# Patient Record
Sex: Female | Born: 1985 | Race: Black or African American | Hispanic: No | Marital: Single | Smoking: Former smoker
Health system: Southern US, Community
[De-identification: ages and names within clinical notes are randomized; demographics above are authoritative.]

## PROBLEM LIST (undated history)

## (undated) DIAGNOSIS — J45909 Unspecified asthma, uncomplicated: Secondary | ICD-10-CM

## (undated) HISTORY — DX: Unspecified asthma, uncomplicated: J45.909

---
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
## 2005-05-06 ENCOUNTER — Ambulatory Visit: Payer: Self-pay | Admitting: Family Medicine

## 2005-05-15 ENCOUNTER — Ambulatory Visit: Payer: Self-pay | Admitting: Family Medicine

## 2019-02-10 NOTE — L&D Delivery Note (Addendum)
OB/GYN Faculty Practice Delivery Note  Hannah Crosby is a 34 y.o. G1P0 s/p low forceps-assisted vaginal delivery at [redacted]w[redacted]d. She was admitted for post dates induction at 41 weeks.   ROM: 14h 46m with meconium-stained fluid fluid GBS Status: negative Maximum Maternal Temperature: 100.6*F  Labor Progress: . Patient was admitted for post dates induction at 41 weeks. Her induction was started with FB and cytotec. She was eventually started on Pitocin and AROMed (with meconium-stained fluid). She developed a fever and was started on Amp/Gent for presumed Chorioamnionitis, FSE and IUPC were placed with amnioinfusion. She had a pitocin break from 1800-2130 on 07/17/19, then pitocin was resumed. She progressed to complete, labored down for about an hour, and started pushing. She was +2 station, and due to minimal variability, late decelerations, and fetal tachycardia an operative vaginal delivery was offered.  Delivery Date/Time: 07/18/19, 0736 hours  Indication for operative vaginal delivery: Fetal tachycardia with late decelerations  Risks of forcep assistance were discussed in detail, including but not limited to, bleeding, infection, damage to maternal tissues, fetal cephalohematoma/bruising, inability to effect vaginal delivery of the head or shoulder dystocia that cannot be resolved by established maneuvers and need for emergency cesarean section.  Patient gave verbal consent.  Patient was examined and found to be fully dilated with fetal station of +2 for the BPD and EFW 3400gm and fetus in LOA. Pelvis felt adequate. Anesthesia, NICU, and the OR were made aware.  Foley catheter removed, and the perineum and vagina were prepped with betadine, as were the forceps. Her epidural was dosed up by anesthesia once NICU team was present. The [employer] were positioned around the fetal head and articulated together without issue.   With the next contraction, the patient pushed and the forceps were  removed just prior to delivery of the head. There was a nuchal which was reduced at the perineum.  The infant was then delivered atraumatically, noted to be a viable female infant. Due to lack of tone and no spontaneous cry, the cord was doubly clamped and cut immediately; and the infant was taken immediately over to the awaiting neonatology team. Arterial and venous cord gases were drawn. There was spontaneous placental delivery, intact with three-vessel cord. Fundus firm with massage and Pitocin. Labia, perineum, vagina, and cervix were inspected, and a 3A perineal laceration with a sulcal tear was noted- repaired with Vicryl in the usual fashion, with 1-0 for the third degree and 2-0 for the sulcal.   Sponge, instrument and needle counts were correct x2.  The patient was stable after delivery. Neonatology present for delivery.  Placenta: intact, 3 vessel cord, to pathology Complications: Code APGAR called, infant intubated in room Lacerations: 3A, left sulcal tear EBL: 500 mL Analgesia: epidural, re-dosed by anesthesia for forceps delivery Venous cord pH: 7.17  Postpartum Planning [x]  message to sent to schedule follow-up  [x]  vaccines UTD  Infant: Boy  APGARs 2, 4, 5  3310 g Venous 7.174, Bicarbonate 20.3 Arterial sample too small to run  , DO OB/GYN Fellow, Faculty Practice    Agree with above. I was present for the entire procedure.   MD Attending [facility])

## 2019-02-17 ENCOUNTER — Encounter: Payer: Self-pay | Admitting: Certified Nurse Midwife

## 2019-02-17 ENCOUNTER — Other Ambulatory Visit: Payer: Self-pay

## 2019-02-17 ENCOUNTER — Ambulatory Visit (INDEPENDENT_AMBULATORY_CARE_PROVIDER_SITE_OTHER): Payer: PRIVATE HEALTH INSURANCE | Admitting: Certified Nurse Midwife

## 2019-02-17 DIAGNOSIS — Z1151 Encounter for screening for human papillomavirus (HPV): Secondary | ICD-10-CM

## 2019-02-17 DIAGNOSIS — Z113 Encounter for screening for infections with a predominantly sexual mode of transmission: Secondary | ICD-10-CM

## 2019-02-17 DIAGNOSIS — B9689 Other specified bacterial agents as the cause of diseases classified elsewhere: Secondary | ICD-10-CM | POA: Diagnosis not present

## 2019-02-17 DIAGNOSIS — Z3A19 19 weeks gestation of pregnancy: Secondary | ICD-10-CM | POA: Diagnosis not present

## 2019-02-17 DIAGNOSIS — Z3402 Encounter for supervision of normal first pregnancy, second trimester: Secondary | ICD-10-CM

## 2019-02-17 DIAGNOSIS — O2602 Excessive weight gain in pregnancy, second trimester: Secondary | ICD-10-CM | POA: Diagnosis not present

## 2019-02-17 DIAGNOSIS — Z124 Encounter for screening for malignant neoplasm of cervix: Secondary | ICD-10-CM

## 2019-02-17 DIAGNOSIS — N760 Acute vaginitis: Secondary | ICD-10-CM | POA: Diagnosis not present

## 2019-02-17 DIAGNOSIS — B373 Candidiasis of vulva and vagina: Secondary | ICD-10-CM | POA: Diagnosis not present

## 2019-02-17 DIAGNOSIS — O99212 Obesity complicating pregnancy, second trimester: Secondary | ICD-10-CM

## 2019-02-17 DIAGNOSIS — Z3400 Encounter for supervision of normal first pregnancy, unspecified trimester: Secondary | ICD-10-CM | POA: Insufficient documentation

## 2019-02-17 DIAGNOSIS — N898 Other specified noninflammatory disorders of vagina: Secondary | ICD-10-CM | POA: Diagnosis not present

## 2019-02-17 DIAGNOSIS — O99210 Obesity complicating pregnancy, unspecified trimester: Secondary | ICD-10-CM | POA: Insufficient documentation

## 2019-02-17 DIAGNOSIS — O2600 Excessive weight gain in pregnancy, unspecified trimester: Secondary | ICD-10-CM | POA: Insufficient documentation

## 2019-02-17 MED ORDER — ASPIRIN 81 MG PO CHEW: 81.0000 mg | CHEWABLE_TABLET | Freq: Every day | ORAL | 1 refills | Status: DC

## 2019-02-17 MED ORDER — PRENATAL VITAMIN 27-0.8 MG PO TABS: 1.0000 | ORAL_TABLET | Freq: Every day | ORAL | 11 refills | Status: DC

## 2019-02-17 MED ORDER — BLOOD PRESSURE CUFF MISC: 1.0000 | 0 refills | Status: DC | PRN

## 2019-02-17 NOTE — Patient Instructions (Addendum)
Second Trimester of Pregnancy The second trimester is from week 14 through week 27 (months 4 through 6). The second trimester is often a time when you feel your best. Your body has adjusted to being pregnant, and you begin to feel better physically. Usually, morning sickness has lessened or quit completely, you may have more energy, and you may have an increase in appetite. The second trimester is also a time when the fetus is growing rapidly. At the end of the sixth month, the fetus is about 9 inches long and weighs about 1 pounds. You will likely begin to feel the baby move (quickening) between 16 and 20 weeks of pregnancy. Body changes during your second trimester Your body continues to go through many changes during your second trimester. The changes vary from woman to woman.  Your weight will continue to increase. You will notice your lower abdomen bulging out.  You may begin to get stretch marks on your hips, abdomen, and breasts.  You may develop headaches that can be relieved by medicines. The medicines should be approved by your health care provider.  You may urinate more often because the fetus is pressing on your bladder.  You may develop or continue to have heartburn as a result of your pregnancy.  You may develop constipation because certain hormones are causing the muscles that push waste through your intestines to slow down.  You may develop hemorrhoids or swollen, bulging veins (varicose veins).  You may have back pain. This is caused by: ? Weight gain. ? Pregnancy hormones that are relaxing the joints in your pelvis. ? A shift in weight and the muscles that support your balance.  Your breasts will continue to grow and they will continue to become tender.  Your gums may bleed and may be sensitive to brushing and flossing.  Dark spots or blotches (chloasma, mask of pregnancy) may develop on your face. This will likely fade after the baby is born.  A dark line from your  belly button to the pubic area (linea nigra) may appear. This will likely fade after the baby is born.  You may have changes in your hair. These can include thickening of your hair, rapid growth, and changes in texture. Some women also have hair loss during or after pregnancy, or hair that feels dry or thin. Your hair will most likely return to normal after your baby is born. What to expect at prenatal visits During a routine prenatal visit:  You will be weighed to make sure you and the fetus are growing normally.  Your blood pressure will be taken.  Your abdomen will be measured to track your baby's growth.  The fetal heartbeat will be listened to.  Any test results from the previous visit will be discussed. Your health care provider may ask you:  How you are feeling.  If you are feeling the baby move.  If you have had any abnormal symptoms, such as leaking fluid, bleeding, severe headaches, or abdominal cramping.  If you are using any tobacco products, including cigarettes, chewing tobacco, and electronic cigarettes.  If you have any questions. Other tests that may be performed during your second trimester include:  Blood tests that check for: ? Low iron levels (anemia). ? High blood sugar that affects pregnant women (gestational diabetes) between 24 and 28 weeks. ? Rh antibodies. This is to check for a protein on red blood cells (Rh factor).  Urine tests to check for infections, diabetes, or protein in the   urine.  An ultrasound to confirm the proper growth and development of the baby.  An amniocentesis to check for possible genetic problems.  Fetal screens for spina bifida and Down syndrome.  HIV (human immunodeficiency virus) testing. Routine prenatal testing includes screening for HIV, unless you choose not to have this test. Follow these instructions at home: Medicines  Follow your health care provider's instructions regarding medicine use. Specific medicines may be  either safe or unsafe to take during pregnancy.  Take a prenatal vitamin that contains at least 600 micrograms (mcg) of folic acid.  If you develop constipation, try taking a stool softener if your health care provider approves. Eating and drinking   Eat a balanced diet that includes fresh fruits and vegetables, whole grains, good sources of protein such as meat, eggs, or tofu, and low-fat dairy. Your health care provider will help you determine the amount of weight gain that is right for you.  Avoid raw meat and uncooked cheese. These carry germs that can cause birth defects in the baby.  If you have low calcium intake from food, talk to your health care provider about whether you should take a daily calcium supplement.  Limit foods that are high in fat and processed sugars, such as fried and sweet foods.  To prevent constipation: ? Drink enough fluid to keep your urine clear or pale yellow. ? Eat foods that are high in fiber, such as fresh fruits and vegetables, whole grains, and beans. Activity  Exercise only as directed by your health care provider. Most women can continue their usual exercise routine during pregnancy. Try to exercise for 30 minutes at least 5 days a week. Stop exercising if you experience uterine contractions.  Avoid heavy lifting, wear low heel shoes, and practice good posture.  A sexual relationship may be continued unless your health care provider directs you otherwise. Relieving pain and discomfort  Wear a good support bra to prevent discomfort from breast tenderness.  Take warm sitz baths to soothe any pain or discomfort caused by hemorrhoids. Use hemorrhoid cream if your health care provider approves.  Rest with your legs elevated if you have leg cramps or low back pain.  If you develop varicose veins, wear support hose. Elevate your feet for 15 minutes, 3-4 times a day. Limit salt in your diet. Prenatal Care  Write down your questions. Take them to  your prenatal visits.  Keep all your prenatal visits as told by your health care provider. This is important. Safety  Wear your seat belt at all times when driving.  Make a list of emergency phone numbers, including numbers for family, friends, the hospital, and police and fire departments. General instructions  Ask your health care provider for a referral to a local prenatal education class. Begin classes no later than the beginning of month 6 of your pregnancy.  Ask for help if you have counseling or nutritional needs during pregnancy. Your health care provider can offer advice or refer you to specialists for help with various needs.  Do not use hot tubs, steam rooms, or saunas.  Do not douche or use tampons or scented sanitary pads.  Do not cross your legs for long periods of time.  Avoid cat litter boxes and soil used by cats. These carry germs that can cause birth defects in the baby and possibly loss of the fetus by miscarriage or stillbirth.  Avoid all smoking, herbs, alcohol, and unprescribed drugs. Chemicals in these products can affect the formation   and growth of the baby.  Do not use any products that contain nicotine or tobacco, such as cigarettes and e-cigarettes. If you need help quitting, ask your health care provider.  Visit your dentist if you have not gone yet during your pregnancy. Use a soft toothbrush to brush your teeth and be gentle when you floss. Contact a health care provider if:  You have dizziness.  You have mild pelvic cramps, pelvic pressure, or nagging pain in the abdominal area.  You have persistent nausea, vomiting, or diarrhea.  You have a bad smelling vaginal discharge.  You have pain when you urinate. Get help right away if:  You have a fever.  You are leaking fluid from your vagina.  You have spotting or bleeding from your vagina.  You have severe abdominal cramping or pain.  You have rapid weight gain or weight loss.  You have  shortness of breath with chest pain.  You notice sudden or extreme swelling of your face, hands, ankles, feet, or legs.  You have not felt your baby move in over an hour.  You have severe headaches that do not go away when you take medicine.  You have vision changes. Summary  The second trimester is from week 14 through week 27 (months 4 through 6). It is also a time when the fetus is growing rapidly.  Your body goes through many changes during pregnancy. The changes vary from woman to woman.  Avoid all smoking, herbs, alcohol, and unprescribed drugs. These chemicals affect the formation and growth your baby.  Do not use any tobacco products, such as cigarettes, chewing tobacco, and e-cigarettes. If you need help quitting, ask your health care provider.  Contact your health care provider if you have any questions. Keep all prenatal visits as told by your health care provider. This is important. This information is not intended to replace advice given to you by your health care provider. Make sure you discuss any questions you have with your health care provider. Document Revised: 05/20/2018 Document Reviewed: 03/03/2016 Elsevier Patient Education  2020 Elsevier Inc.   AREA PEDIATRIC/FAMILY PRACTICE PHYSICIANS  [facility] [address] [place], [place] [address] Phone - [phone]   Fax - [phone]  [facility] [address] [place], [place]  [address] Phone - [phone]   Fax - [phone]  [facility] [address] [place], [place]  [address] Phone - [phone]   Fax - [phone]  [facility] [address] [place], [place]  [address] Phone - [phone]   Fax - [phone]  [facility] [address] [place], [place]  [address] Phone - [phone]   Fax - [phone]  [facility] [address] [place], [place]  [address] Phone -  [phone]   Fax - [phone]  [facility] [address] [place], [place]  [address] Phone - [phone]   Fax - [phone]  [facility] AT [facility] [address] [place], [address] [place], [place]  [address] Phone - [phone]   Fax - [phone]  [facility] AT [facility] [address] [place], [place]  [address] Phone - [phone]   Fax - [phone] [facility] AT [place] [address] [place], [place]  [address] Phone - [phone]   Fax - [phone]  [facility] [address] [place], [place]  [address] Phone -  [phone]   Fax - [phone]  [place], [place], [place]  [address] Phone - [phone]   Fax - [place] [address], [place], [place]  [address] Phone - [phone]   Fax - [phone]  [facility] [address], [place], [place]  [address] Phone - [place], [place]  [address] Phone - [phone]   Fax - [place] [address], [place], [place]  [address] Phone - [phone]   Fax - [phone]  [place] [address] [place], [place]  [address] Phone - [phone]   Fax - [place]. [place], [place]  [address] Phone - [phone]   Fax - [place], [place], [place]  [address] Phone - [phone]   Fax - [place] [address], [place], [place]  [address] Phone - [phone]   Fax - [phone]  [facility] RUBIN [address] [place], [place]  [address] Phone - [phone]   Fax - [place] [address], [place], [place]  [address] Phone - [phone]   Fax - [phone]  [place] [address] [place], [place]  [address] Phone - [phone]   Fax - [phone] Arnaldo Natal [address] [place], [place]  [address] Phone - [phone]   Fax - [place] [address] [place], [place]  [address] Phone - [phone]   Fax - [place] [address], [place], [place]  [address] Phone - [phone]   Fax - [phone]   Places to have your son circumcised:    [facility] [phone] $480 by 4 wks  [facility] [phone] $[phone] by 4 wks  [facility] [phone] $[phone] by 2 wks  [facility] [phone] $250 by 7 days [place] [phone] $[phone] by 4 wks  These prices sometimes change but are roughly what you can expect to pay. Please call and confirm pricing.   Circumcision is considered an elective/non-medically necessary procedure. There are many reasons parents decide to have their sons circumsized. During the first year of life circumcised males have a reduced risk of urinary tract infections but after this year the rates between circumcised males and uncircumcised males are the same.  It is safe to have your son circumcised outside of the hospital and the places above perform them regularly.

## 2019-02-17 NOTE — Progress Notes (Signed)
BP cuff ordered per Donette Larry, CNM

## 2019-02-17 NOTE — Progress Notes (Signed)
Subjective:   Hannah Crosby is a 34 y.o. G1P0 at [redacted]w[redacted]d by LMP, and 10 wk [place] being seen today for her first obstetrical visit. She started her prenatal care in [place]. Her obstetrical history is significant for excessive weight gain and obesity. Patient does intend to breast feed. Pregnancy history fully reviewed.  Patient reports no complaints.  HISTORY: OB History  Gravida Para Term Preterm AB Living  1 0 0 0 0 0  SAB TAB Ectopic Multiple Live Births  0 0 0 0 0    # Outcome Date GA Lbr Len/2nd Weight Sex Delivery Anes PTL Lv  1 Current            Past Medical History:  Diagnosis Date  . Asthma    History reviewed. No pertinent surgical history. Family History  Problem Relation Age of Onset  . Diabetes Mother   . Diabetes Father   . Heart disease Father    Social History   Tobacco Use  . Smoking status: Former Research scientist (life sciences)  . Smokeless tobacco: Never Used  Substance Use Topics  . Alcohol use: Never  . Drug use: Never   No Known Allergies Current Outpatient Medications on File Prior to Visit  Medication Sig Dispense Refill  . ALBUTEROL IN Inhale into the lungs.    . Prenatal Vit-Fe Fumarate-FA (PRENATAL VITAMINS PO) Take by mouth.     No current facility-administered medications on file prior to visit.    Indications for ASA therapy (per uptodate) One of the following: Previous pregnancy with preeclampsia, especially early onset and with an adverse outcome No Multifetal gestation No Chronic hypertension No Type 1 or 2 diabetes mellitus No Chronic kidney disease No Autoimmune disease (antiphospholipid syndrome, systemic lupus erythematosus) Yes  Two or more of the following: Nulliparity Yes Obesity (body mass index >30 kg/m2) Yes Family history of preeclampsia in mother or sister No Age ?35 years No Sociodemographic characteristics (African American race, low socioeconomic level) Yes Personal risk factors (eg, previous pregnancy with low birth weight or  small for gestational age infant, previous adverse pregnancy outcome [eg, stillbirth], interval >10 years between pregnancies) No  Indications for early 1 hour GTT (per uptodate)  BMI >25 (>23 in Asian women) AND one of the following  Gestational diabetes mellitus in a previous pregnancy No Glycated hemoglobin ?5.7 percent (39 mmol/mol), impaired glucose tolerance, or impaired fasting glucose on previous testing No First-degree relative with diabetes Yes High-risk race/ethnicity (eg, African American, Latino, Native American, [place] American, Pacific Islander) Yes History of cardiovascular disease No Hypertension or on therapy for hypertension No High-density lipoprotein cholesterol level <35 mg/dL (0.90 mmol/L) and/or a triglyceride level >250 mg/dL (2.82 mmol/L) No Polycystic ovary syndrome No Physical inactivity No Other clinical condition associated with insulin resistance (eg, severe obesity, acanthosis nigricans) No Previous birth of an infant weighing ?4000 g No Previous stillbirth of unknown cause No   Exam   Vitals:   02/17/19 0946 02/17/19 0947  BP: 124/81   Pulse: 98   Temp: 98.2 F (36.8 C)   Weight: 214 lb (97.1 kg)   Height:  5\' 2"  (1.575 m)   Fetal Heart Rate (bpm): 140  Uterus:     Pelvic Exam: Perineum: no hemorrhoids, normal perineum   Vulva: normal external genitalia, no lesions   Vagina:  normal mucosa, normal discharge   Cervix: no lesions and normal, pap smear obtained    Adnexa: normal adnexa and no mass, fullness, tenderness   Bony Pelvis: average  System: General: well-developed, well-nourished female in no acute distress   Breast:  deferred   Skin: normal coloration and turgor, no rashes   Neurologic: oriented, normal, negative, normal mood   Extremities: normal strength, tone, and muscle mass, ROM of all joints is normal   HEENT PERRLA, extraocular movement intact and sclera clear, anicteric   Mouth/Teeth mucous membranes moist, pharynx normal  without lesions and dental hygiene good   Neck supple and no masses   Cardiovascular: regular rate and rhythm   Respiratory:  no respiratory distress, normal breath sounds   Abdomen: soft, non-tender; bowel sounds normal; no masses,  no organomegaly     Assessment:   Pregnancy: G1P0 Patient Active Problem List   Diagnosis Date Noted  . Supervision of normal first pregnancy 02/17/2019  . Obesity affecting pregnancy 02/17/2019  . Excess weight gain in pregnancy 02/17/2019     Plan:  1. Encounter for supervision of normal first pregnancy in second trimester - Babyscripts Schedule Optimization - Obstetric panel - HIV antibody (with reflex) - Panorama or [place] - Culture, OB Urine - Sickle Cell Scr - Prenatal Vit-Fe Fumarate-FA (PRENATAL VITAMIN) 27-0.8 MG TABS; Take 1 tablet by mouth daily.  Dispense: 30 tablet; Refill: 11 - Cervicovaginal ancillary only( [place]) - Cytology - PAP( [place]) - MFM [place] Detailed +14 Weeks  2. Obesity affecting pregnancy in second trimester - needs serial growth [place]  3. Excessive weight gain during pregnancy in second trimester - discusses weight gain goals - decrease carbs, increase protein, smaller meals and snacks; eliminate sugary foods and drinks - exercise 5 times a week for 30-45 min  Initial labs drawn. Early 1 hour GTT - will obtain A1C Started on ASA  Flu vax declined Continue prenatal vitamins Genetic Screening discussed, NIPS: requested. Ultrasound discussed; fetal anatomic survey: requested. Problem list reviewed and updated The nature of [facility] [place] with multiple MDs and other Advanced Practice Providers was explained to patient; also emphasized that fellows, residents, and students are part of our team. Routine obstetric precautions reviewed Return in about 4 weeks (around 03/17/2019).   Donette Larry, CNM 11:19 AM 02/17/19

## 2019-02-18 LAB — VITAMIN D 25 HYDROXY (VIT D DEFICIENCY, FRACTURES): Vit D, 25-Hydroxy: 14 ng/mL — ABNORMAL LOW (ref 30–100)

## 2019-02-18 LAB — HEMOGLOBIN A1C
Hgb A1c MFr Bld: 5.4 % of total Hgb (ref <5.7)
Mean Plasma Glucose: 108 (calc)
eAG (mmol/L): 6 (calc)

## 2019-02-19 LAB — URINE CULTURE, OB REFLEX

## 2019-02-19 LAB — CULTURE, OB URINE

## 2019-02-20 ENCOUNTER — Encounter: Payer: Self-pay | Admitting: *Deleted

## 2019-02-20 ENCOUNTER — Other Ambulatory Visit: Payer: Self-pay | Admitting: *Deleted

## 2019-02-20 ENCOUNTER — Telehealth: Payer: Self-pay | Admitting: *Deleted

## 2019-02-20 ENCOUNTER — Encounter: Payer: Self-pay | Admitting: Certified Nurse Midwife

## 2019-02-20 DIAGNOSIS — E559 Vitamin D deficiency, unspecified: Secondary | ICD-10-CM | POA: Insufficient documentation

## 2019-02-20 LAB — OBSTETRIC PANEL
Absolute Monocytes: 507 cells/uL (ref 200–950)
Antibody Screen: NOT DETECTED
Basophils Absolute: 18 cells/uL (ref 0–200)
Basophils Relative: 0.3 %
Eosinophils Absolute: 360 cells/uL (ref 15–500)
Eosinophils Relative: 6.1 %
HCT: 38.3 % (ref 35.0–45.0)
Hemoglobin: 12.7 g/dL (ref 11.7–15.5)
Hepatitis B Surface Ag: NONREACTIVE
Lymphs Abs: 1611 cells/uL (ref 850–3900)
MCH: 29.3 pg (ref 27.0–33.0)
MCHC: 33.2 g/dL (ref 32.0–36.0)
MCV: 88.2 fL (ref 80.0–100.0)
MPV: 11.7 fL (ref 7.5–12.5)
Monocytes Relative: 8.6 %
Neutro Abs: 3404 cells/uL (ref 1500–7800)
Neutrophils Relative %: 57.7 %
Platelets: 237 10*3/uL (ref 140–400)
RBC: 4.34 10*6/uL (ref 3.80–5.10)
RDW: 13.2 % (ref 11.0–15.0)
RPR Ser Ql: NONREACTIVE
Rubella: 15.7 Index
Total Lymphocyte: 27.3 %
WBC: 5.9 10*3/uL (ref 3.8–10.8)

## 2019-02-20 LAB — CERVICOVAGINAL ANCILLARY ONLY
Bacterial Vaginitis (gardnerella): POSITIVE — AB
Candida Glabrata: NEGATIVE
Candida Vaginitis: POSITIVE — AB
Chlamydia: NEGATIVE
Comment: NEGATIVE
Comment: NEGATIVE
Comment: NEGATIVE
Comment: NEGATIVE
Comment: NEGATIVE
Comment: NORMAL
Neisseria Gonorrhea: NEGATIVE
Trichomonas: NEGATIVE

## 2019-02-20 LAB — CYTOLOGY - PAP
Comment: NEGATIVE
Diagnosis: NEGATIVE
High risk HPV: NEGATIVE

## 2019-02-20 LAB — HIV ANTIBODY (ROUTINE TESTING W REFLEX): HIV 1&2 Ab, 4th Generation: NONREACTIVE

## 2019-02-20 LAB — SICKLE CELL SCREEN: Sickle Solubility Test - HGBRFX: NEGATIVE

## 2019-02-20 MED ORDER — VITAMIN D 25 MCG (1000 UNIT) PO TABS: 1000.0000 [IU] | ORAL_TABLET | Freq: Every day | ORAL | 6 refills | Status: DC

## 2019-02-20 NOTE — Telephone Encounter (Signed)
Notified patient through my chart that correction made on her Vitamin D RX.  She is to be taking 4000 units daily per [facility]

## 2019-02-28 ENCOUNTER — Encounter: Payer: Self-pay | Admitting: *Deleted

## 2019-02-28 DIAGNOSIS — Z3402 Encounter for supervision of normal first pregnancy, second trimester: Secondary | ICD-10-CM

## 2019-03-07 ENCOUNTER — Other Ambulatory Visit: Payer: Self-pay

## 2019-03-07 MED ORDER — BLOOD PRESSURE CUFF MISC: 1.0000 | 0 refills | Status: DC | PRN

## 2019-03-07 NOTE — Progress Notes (Signed)
Pt needs Rx for blood pressure cuff printed.

## 2019-03-09 ENCOUNTER — Encounter (HOSPITAL_COMMUNITY): Payer: Self-pay

## 2019-03-09 ENCOUNTER — Other Ambulatory Visit: Payer: Self-pay

## 2019-03-09 ENCOUNTER — Ambulatory Visit (HOSPITAL_COMMUNITY)
Admission: RE | Admit: 2019-03-09 | Discharge: 2019-03-09 | Disposition: A | Discharge status: Home / Self Care | Payer: Medicaid Other | Source: Ambulatory Visit | Attending: Obstetrics and Gynecology | Admitting: Obstetrics and Gynecology

## 2019-03-09 ENCOUNTER — Other Ambulatory Visit: Payer: Self-pay | Admitting: *Deleted

## 2019-03-09 ENCOUNTER — Ambulatory Visit (HOSPITAL_COMMUNITY): Payer: Medicaid Other | Admitting: *Deleted

## 2019-03-09 DIAGNOSIS — Z3A22 22 weeks gestation of pregnancy: Secondary | ICD-10-CM | POA: Diagnosis not present

## 2019-03-09 DIAGNOSIS — O2602 Excessive weight gain in pregnancy, second trimester: Secondary | ICD-10-CM | POA: Diagnosis present

## 2019-03-09 DIAGNOSIS — Z3402 Encounter for supervision of normal first pregnancy, second trimester: Secondary | ICD-10-CM | POA: Diagnosis not present

## 2019-03-09 DIAGNOSIS — O99212 Obesity complicating pregnancy, second trimester: Secondary | ICD-10-CM | POA: Diagnosis not present

## 2019-03-09 DIAGNOSIS — Z3490 Encounter for supervision of normal pregnancy, unspecified, unspecified trimester: Secondary | ICD-10-CM

## 2019-03-09 DIAGNOSIS — E559 Vitamin D deficiency, unspecified: Secondary | ICD-10-CM | POA: Diagnosis present

## 2019-03-09 NOTE — Progress Notes (Signed)
BRX opt sched order placed

## 2019-03-10 ENCOUNTER — Other Ambulatory Visit (HOSPITAL_COMMUNITY): Payer: Self-pay | Admitting: *Deleted

## 2019-03-10 DIAGNOSIS — Z362 Encounter for other antenatal screening follow-up: Secondary | ICD-10-CM

## 2019-03-17 ENCOUNTER — Ambulatory Visit (INDEPENDENT_AMBULATORY_CARE_PROVIDER_SITE_OTHER): Payer: Medicaid Other | Admitting: Obstetrics and Gynecology

## 2019-03-17 ENCOUNTER — Other Ambulatory Visit: Payer: Self-pay

## 2019-03-17 VITALS — BP 122/71 | HR 98 | Temp 97.5°F | Wt 221.0 lb

## 2019-03-17 DIAGNOSIS — O99212 Obesity complicating pregnancy, second trimester: Secondary | ICD-10-CM

## 2019-03-17 DIAGNOSIS — Z3490 Encounter for supervision of normal pregnancy, unspecified, unspecified trimester: Secondary | ICD-10-CM

## 2019-03-17 DIAGNOSIS — Z3A23 23 weeks gestation of pregnancy: Secondary | ICD-10-CM

## 2019-03-17 DIAGNOSIS — Z3402 Encounter for supervision of normal first pregnancy, second trimester: Secondary | ICD-10-CM

## 2019-03-17 MED ORDER — ONE-A-DAY WOMENS PRENATAL 1 28-0.8-235 MG PO CAPS: 1.0000 | ORAL_CAPSULE | Freq: Every day | ORAL | 3 refills | Status: DC

## 2019-03-17 NOTE — Progress Notes (Signed)
   PRENATAL VISIT NOTE  Subjective:  Hannah Crosby is a 34 y.o. G1P0 at [redacted]w[redacted]d being seen today for ongoing prenatal care.  She is currently monitored for the following issues for this low-risk pregnancy and has Supervision of normal first pregnancy; Obesity affecting pregnancy; Excess weight gain in pregnancy; and Vitamin D deficiency on their problem list.  Patient reports round ligament pain during walking.  Contractions: Not present. Vag. Bleeding: None.  Movement: Present. Denies leaking of fluid.   The following portions of the patient's history were reviewed and updated as appropriate: allergies, current medications, past family history, past medical history, past social history, past surgical history and problem list.   Objective:   Vitals:   03/17/19 0951  BP: 122/71  Pulse: 98  Temp: (!) 97.5 F (36.4 C)  Weight: 221 lb (100.2 kg)    Fetal Status: Fetal Heart Rate (bpm): 149 Fundal Height: 25 cm Movement: Present     General:  Alert, oriented and cooperative. Patient is in no acute distress.  Skin: Skin is warm and dry. No rash noted.   Cardiovascular: Normal heart rate noted  Respiratory: Normal respiratory effort, no problems with respiration noted  Abdomen: Soft, gravid, appropriate for gestational age.  Pain/Pressure: Present     Pelvic: Cervical exam deferred        Extremities: Normal range of motion.     Mental Status: Normal mood and affect. Normal behavior. Normal judgment and thought content.   Assessment and Plan:  Pregnancy: G1P0 at [redacted]w[redacted]d  1. Encounter for supervision of normal pregnancy, antepartum, unspecified gravidity  - 2Hr GTT w/ 1 Hr Carpenter 75 g - Babyscripts Schedule Optimization - Pregnancy support belt recommended.   2. Obesity affecting pregnancy in second trimester  Continue BASA  Preterm labor symptoms and general obstetric precautions including but not limited to vaginal bleeding, contractions, leaking of fluid and fetal movement  were reviewed in detail with the patient. Please refer to After Visit Summary for other counseling recommendations.   Return in about 4 weeks (around 04/14/2019).  Future Appointments  Date Time Provider Department Center  04/06/2019  3:45 PM [facility] NURSE [facility]  04/06/2019  3:45 PM [facility] [place] 2 [facility]  04/14/2019  9:30 AM Nugent, Odie Sera, NP [facility]    Venia Carbon, NP

## 2019-03-17 NOTE — Patient Instructions (Signed)

## 2019-03-17 NOTE — Addendum Note (Signed)
Addended by: Kathie Dike on: 03/17/2019 11:09 AM   Modules accepted: Orders

## 2019-03-18 LAB — GLUCOSE TOLERANCE, 1 HOUR (50G) W/O FASTING: Glucose, 1 Hr, gestational: 96 mg/dL (ref 65–139)

## 2019-03-22 ENCOUNTER — Telehealth: Payer: Self-pay | Admitting: *Deleted

## 2019-03-22 NOTE — Telephone Encounter (Signed)
Left patient an urgent message to call the office about appointment on 04/14/2019. Patient needs to be fasting for 2 hr GTT per Venia Carbon, and come earlier than 9:30 AM appointment time.

## 2019-04-06 ENCOUNTER — Ambulatory Visit (HOSPITAL_COMMUNITY): Payer: Medicaid Other | Admitting: *Deleted

## 2019-04-06 ENCOUNTER — Telehealth: Payer: Self-pay | Admitting: *Deleted

## 2019-04-06 ENCOUNTER — Ambulatory Visit (HOSPITAL_COMMUNITY)
Admission: RE | Admit: 2019-04-06 | Discharge: 2019-04-06 | Disposition: A | Discharge status: Home / Self Care | Payer: Medicaid Other | Source: Ambulatory Visit | Attending: Obstetrics and Gynecology | Admitting: Obstetrics and Gynecology

## 2019-04-06 ENCOUNTER — Encounter (HOSPITAL_COMMUNITY): Payer: Self-pay

## 2019-04-06 ENCOUNTER — Other Ambulatory Visit: Payer: Self-pay

## 2019-04-06 DIAGNOSIS — E559 Vitamin D deficiency, unspecified: Secondary | ICD-10-CM

## 2019-04-06 DIAGNOSIS — Z3A26 26 weeks gestation of pregnancy: Secondary | ICD-10-CM

## 2019-04-06 DIAGNOSIS — O99212 Obesity complicating pregnancy, second trimester: Secondary | ICD-10-CM

## 2019-04-06 DIAGNOSIS — O2602 Excessive weight gain in pregnancy, second trimester: Secondary | ICD-10-CM

## 2019-04-06 DIAGNOSIS — Z3402 Encounter for supervision of normal first pregnancy, second trimester: Secondary | ICD-10-CM | POA: Diagnosis present

## 2019-04-06 DIAGNOSIS — Z362 Encounter for other antenatal screening follow-up: Secondary | ICD-10-CM | POA: Diagnosis not present

## 2019-04-06 MED ORDER — ONE-A-DAY WOMENS PRENATAL 1 28-0.8-235 MG PO CAPS: 1.0000 | ORAL_CAPSULE | Freq: Every day | ORAL | 12 refills | Status: DC

## 2019-04-06 NOTE — Telephone Encounter (Signed)
t was given One a Day PNV samples and is requesting a RX for them sent to her pharmacy.

## 2019-04-07 ENCOUNTER — Other Ambulatory Visit (HOSPITAL_COMMUNITY): Payer: Self-pay | Admitting: *Deleted

## 2019-04-07 DIAGNOSIS — O99213 Obesity complicating pregnancy, third trimester: Secondary | ICD-10-CM

## 2019-04-14 ENCOUNTER — Ambulatory Visit (INDEPENDENT_AMBULATORY_CARE_PROVIDER_SITE_OTHER): Payer: Medicaid Other | Admitting: Women's Health

## 2019-04-14 ENCOUNTER — Other Ambulatory Visit: Payer: Self-pay

## 2019-04-14 VITALS — BP 116/79 | HR 89 | Temp 98.1°F | Wt 220.0 lb

## 2019-04-14 DIAGNOSIS — Z3402 Encounter for supervision of normal first pregnancy, second trimester: Secondary | ICD-10-CM

## 2019-04-14 DIAGNOSIS — Z3A27 27 weeks gestation of pregnancy: Secondary | ICD-10-CM

## 2019-04-14 DIAGNOSIS — O99212 Obesity complicating pregnancy, second trimester: Secondary | ICD-10-CM

## 2019-04-14 DIAGNOSIS — E669 Obesity, unspecified: Secondary | ICD-10-CM

## 2019-04-14 NOTE — Patient Instructions (Addendum)
Maternity Assessment Unit (MAU)  The Maternity Assessment Unit (MAU) is located at the [facility] at [facility]. The address is: [address], [place], [place], [place] [address]. Please see map below for additional directions.    The Maternity Assessment Unit is designed to help you during your pregnancy, and for up to 6 weeks after delivery, with any pregnancy- or postpartum-related emergencies, if you think you are in labor, or if your water has broken. For example, if you experience nausea and vomiting, vaginal bleeding, severe abdominal or pelvic pain, elevated blood pressure or other problems related to your pregnancy or postpartum time, please come to the Maternity Assessment Unit for assistance.   Contraception Choices  -www.bedsider.org Contraception, also called birth control, refers to methods or devices that prevent pregnancy. Hormonal methods Contraceptive implant  A contraceptive implant is a thin, plastic tube that contains a hormone. It is inserted into the upper part of the arm. It can remain in place for up to 3 years. Progestin-only injections Progestin-only injections are injections of progestin, a synthetic form of the hormone progesterone. They are given every 3 months by a health care provider. Birth control pills  Birth control pills are pills that contain hormones that prevent pregnancy. They must be taken once a day, preferably at the same time each day. Birth control patch  The birth control patch contains hormones that prevent pregnancy. It is placed on the skin and must be changed once a week for three weeks and removed on the fourth week. A prescription is needed to use this method of contraception. Vaginal ring  A vaginal ring contains hormones that prevent pregnancy. It is placed in the vagina for three weeks and removed on the fourth week. After that, the process is repeated with a new ring. A prescription is needed to use  this method of contraception. Emergency contraceptive Emergency contraceptives prevent pregnancy after unprotected sex. They come in pill form and can be taken up to 5 days after sex. They work best the sooner they are taken after having sex. Most emergency contraceptives are available without a prescription. This method should not be used as your only form of birth control. Barrier methods Female condom  A female condom is a thin sheath that is worn over the penis during sex. Condoms keep sperm from going inside a woman's body. They can be used with a spermicide to increase their effectiveness. They should be disposed after a single use. Female condom  A female condom is a soft, loose-fitting sheath that is put into the vagina before sex. The condom keeps sperm from going inside a woman's body. They should be disposed after a single use. Diaphragm  A diaphragm is a soft, dome-shaped barrier. It is inserted into the vagina before sex, along with a spermicide. The diaphragm blocks sperm from entering the uterus, and the spermicide kills sperm. A diaphragm should be left in the vagina for 6-8 hours after sex and removed within 24 hours. A diaphragm is prescribed and fitted by a health care provider. A diaphragm should be replaced every 1-2 years, after giving birth, after gaining more than 15 lb (6.8 kg), and after pelvic surgery. Cervical cap  A cervical cap is a round, soft latex or plastic cup that fits over the cervix. It is inserted into the vagina before sex, along with spermicide. It blocks sperm from entering the uterus. The cap should be left in place for 6-8 hours after sex and  removed within 48 hours. A cervical cap must be prescribed and fitted by a health care provider. It should be replaced every 2 years. Sponge  A sponge is a soft, circular piece of polyurethane foam with spermicide on it. The sponge helps block sperm from entering the uterus, and the spermicide kills sperm. To use it,  you make it wet and then insert it into the vagina. It should be inserted before sex, left in for at least 6 hours after sex, and removed and thrown away within 30 hours. Spermicides Spermicides are chemicals that kill or block sperm from entering the cervix and uterus. They can come as a cream, jelly, suppository, foam, or tablet. A spermicide should be inserted into the vagina with an applicator at least 10-15 minutes before sex to allow time for it to work. The process must be repeated every time you have sex. Spermicides do not require a prescription. Intrauterine contraception Intrauterine device (IUD) An IUD is a T-shaped device that is put in a woman's uterus. There are two types:  Hormone IUD.This type contains progestin, a synthetic form of the hormone progesterone. This type can stay in place for 3-5 years.  Copper IUD.This type is wrapped in copper wire. It can stay in place for 10 years.  Permanent methods of contraception Female tubal ligation In this method, a woman's fallopian tubes are sealed, tied, or blocked during surgery to prevent eggs from traveling to the uterus. Hysteroscopic sterilization In this method, a small, flexible insert is placed into each fallopian tube. The inserts cause scar tissue to form in the fallopian tubes and block them, so sperm cannot reach an egg. The procedure takes about 3 months to be effective. Another form of birth control must be used during those 3 months. Female sterilization This is a procedure to tie off the tubes that carry sperm (vasectomy). After the procedure, the man can still ejaculate fluid (semen). Natural planning methods Natural family planning In this method, a couple does not have sex on days when the woman could become pregnant. Calendar method This means keeping track of the length of each menstrual cycle, identifying the days when pregnancy can happen, and not having sex on those days. Ovulation method In this method, a  couple avoids sex during ovulation. Symptothermal method This method involves not having sex during ovulation. The woman typically checks for ovulation by watching changes in her temperature and in the consistency of cervical mucus. Post-ovulation method In this method, a couple waits to have sex until after ovulation. Summary  Contraception, also called birth control, means methods or devices that prevent pregnancy.  Hormonal methods of contraception include implants, injections, pills, patches, vaginal rings, and emergency contraceptives.  Barrier methods of contraception can include female condoms, female condoms, diaphragms, cervical caps, sponges, and spermicides.  There are two types of IUDs (intrauterine devices). An IUD can be put in a woman's uterus to prevent pregnancy for 3-5 years.  Permanent sterilization can be done through a procedure for males, females, or both.  Natural family planning methods involve not having sex on days when the woman could become pregnant. This information is not intended to replace advice given to you by your health care provider. Make sure you discuss any questions you have with your health care provider. Document Revised: 01/28/2017 Document Reviewed: 02/29/2016 Elsevier Patient Education  2020 [employer].  Preterm Labor and Birth Information  The normal length of a pregnancy is 39-41 weeks. Preterm labor is when labor starts  before 37 completed weeks of pregnancy. What are the risk factors for preterm labor? Preterm labor is more likely to occur in women who:  Have certain infections during pregnancy such as a bladder infection, sexually transmitted infection, or infection inside the uterus (chorioamnionitis).  Have a shorter-than-normal cervix.  Have gone into preterm labor before.  Have had surgery on their cervix.  Are younger than age 86 or older than age 43.  Are African American.  Are pregnant with twins or multiple babies  (multiple gestation).  Take street drugs or smoke while pregnant.  Do not gain enough weight while pregnant.  Became pregnant shortly after having been pregnant. What are the symptoms of preterm labor? Symptoms of preterm labor include:  Cramps similar to those that can happen during a menstrual period. The cramps may happen with diarrhea.  Pain in the abdomen or lower back.  Regular uterine contractions that may feel like tightening of the abdomen.  A feeling of increased pressure in the pelvis.  Increased watery or bloody mucus discharge from the vagina.  Water breaking (ruptured amniotic sac). Why is it important to recognize signs of preterm labor? It is important to recognize signs of preterm labor because babies who are born prematurely may not be fully developed. This can put them at an increased risk for:  Long-term (chronic) heart and lung problems.  Difficulty immediately after birth with regulating body systems, including blood sugar, body temperature, heart rate, and breathing rate.  Bleeding in the brain.  Cerebral palsy.  Learning difficulties.  Death. These risks are highest for babies who are born before 56 weeks of pregnancy. How is preterm labor treated? Treatment depends on the length of your pregnancy, your condition, and the health of your baby. It may involve:  Having a stitch (suture) placed in your cervix to prevent your cervix from opening too early (cerclage).  Taking or being given medicines, such as: ? Hormone medicines. These may be given early in pregnancy to help support the pregnancy. ? Medicine to stop contractions. ? Medicines to help mature the baby's lungs. These may be prescribed if the risk of delivery is high. ? Medicines to prevent your baby from developing cerebral palsy. If the labor happens before 34 weeks of pregnancy, you may need to stay in the hospital. What should I do if I think I am in preterm labor? If you think that  you are going into preterm labor, call your health care provider right away. How can I prevent preterm labor in future pregnancies? To increase your chance of having a full-term pregnancy:  Do not use any tobacco products, such as cigarettes, chewing tobacco, and e-cigarettes. If you need help quitting, ask your health care provider.  Do not use street drugs or medicines that have not been prescribed to you during your pregnancy.  Talk with your health care provider before taking any herbal supplements, even if you have been taking them regularly.  Make sure you gain a healthy amount of weight during your pregnancy.  Watch for infection. If you think that you might have an infection, get it checked right away.  Make sure to tell your health care provider if you have gone into preterm labor before. This information is not intended to replace advice given to you by your health care provider. Make sure you discuss any questions you have with your health care provider. Document Revised: 05/20/2018 Document Reviewed: 06/19/2015 Elsevier Patient Education  [place].  Preeclampsia and Eclampsia  Preeclampsia is a serious condition that may develop during pregnancy. This condition causes high blood pressure and increased protein in your urine along with other symptoms, such as headaches and vision changes. These symptoms may develop as the condition gets worse. Preeclampsia may occur at 20 weeks of pregnancy or later. Diagnosing and treating preeclampsia early is very important. If not treated early, it can cause serious problems for you and your baby. One problem it can lead to is eclampsia. Eclampsia is a condition that causes muscle jerking or shaking (convulsions or seizures) and other serious problems for the mother. During pregnancy, delivering your baby may be the best treatment for preeclampsia or eclampsia. For most women, preeclampsia and eclampsia symptoms go away after giving  birth. In rare cases, a woman may develop preeclampsia after giving birth (postpartum preeclampsia). This usually occurs within 48 hours after childbirth but may occur up to 6 weeks after giving birth. What are the causes? The cause of preeclampsia is not known. What increases the risk? The following risk factors make you more likely to develop preeclampsia:  Being pregnant for the first time.  Having had preeclampsia during a past pregnancy.  Having a family history of preeclampsia.  Having high blood pressure.  Being pregnant with more than one baby.  Being 48 or older.  Being African-American.  Having kidney disease or diabetes.  Having medical conditions such as lupus or blood diseases.  Being very overweight (obese). What are the signs or symptoms? The most common symptoms are:  Severe headaches.  Vision problems, such as blurred or double vision.  Abdominal pain, especially upper abdominal pain. Other symptoms that may develop as the condition gets worse include:  Sudden weight gain.  Sudden swelling of the hands, face, legs, and feet.  Severe nausea and vomiting.  Numbness in the face, arms, legs, and feet.  Dizziness.  Urinating less than usual.  Slurred speech.  Convulsions or seizures. How is this diagnosed? There are no screening tests for preeclampsia. Your health care provider will ask you about symptoms and check for signs of preeclampsia during your prenatal visits. You may also have tests that include:  Checking your blood pressure.  Urine tests to check for protein. Your health care provider will check for this at every prenatal visit.  Blood tests.  Monitoring your baby's heart rate.  Ultrasound. How is this treated? You and your health care provider will determine the treatment approach that is best for you. Treatment may include:  Having more frequent prenatal exams to check for signs of preeclampsia, if you have an increased risk  for preeclampsia.  Medicine to lower your blood pressure.  Staying in the hospital, if your condition is severe. There, treatment will focus on controlling your blood pressure and the amount of fluids in your body (fluid retention).  Taking medicine (magnesium sulfate) to prevent seizures. This may be given as an injection or through an IV.  Taking a low-dose aspirin during your pregnancy.  Delivering your baby early. You may have your labor started with medicine (induced), or you may have a cesarean delivery. Follow these instructions at home: Eating and drinking   Drink enough fluid to keep your urine pale yellow.  Avoid caffeine. Lifestyle  Do not use any products that contain nicotine or tobacco, such as cigarettes and e-cigarettes. If you need help quitting, ask your health care provider.  Do not use alcohol or drugs.  Avoid stress as much as possible. Rest and get plenty of  sleep. General instructions  Take over-the-counter and prescription medicines only as told by your health care provider.  When lying down, lie on your left side. This keeps pressure off your major blood vessels.  When sitting or lying down, raise (elevate) your feet. Try putting some pillows underneath your lower legs.  Exercise regularly. Ask your health care provider what kinds of exercise are best for you.  Keep all follow-up and prenatal visits as told by your health care provider. This is important. How is this prevented? There is no known way of preventing preeclampsia or eclampsia from developing. However, to lower your risk of complications and detect problems early:  Get regular prenatal care. Your health care provider may be able to diagnose and treat the condition early.  Maintain a healthy weight. Ask your health care provider for help managing weight gain during pregnancy.  Work with your health care provider to manage any long-term (chronic) health conditions you have, such as  diabetes or kidney problems.  You may have tests of your blood pressure and kidney function after giving birth.  Your health care provider may have you take low-dose aspirin during your next pregnancy. Contact a health care provider if:  You have symptoms that your health care provider told you may require more treatment or monitoring, such as: ? Headaches. ? Nausea or vomiting. ? Abdominal pain. ? Dizziness. ? Light-headedness. Get help right away if:  You have severe: ? Abdominal pain. ? Headaches that do not get better. ? Dizziness. ? Vision problems. ? Confusion. ? Nausea or vomiting.  You have any of the following: ? A seizure. ? Sudden, rapid weight gain. ? Sudden swelling in your hands, ankles, or face. ? Trouble moving any part of your body. ? Numbness in any part of your body. ? Trouble speaking. ? Abnormal bleeding.  You faint. Summary  Preeclampsia is a serious condition that may develop during pregnancy.  This condition causes high blood pressure and increased protein in your urine along with other symptoms, such as headaches and vision changes.  Diagnosing and treating preeclampsia early is very important. If not treated early, it can cause serious problems for you and your baby.  Get help right away if you have symptoms that your health care provider told you to watch for. This information is not intended to replace advice given to you by your health care provider. Make sure you discuss any questions you have with your health care provider. Document Revised: 09/28/2017 Document Reviewed: 09/02/2015 Elsevier Patient Education  2020 [employer].

## 2019-04-14 NOTE — Progress Notes (Addendum)
Subjective:  Hannah Crosby is a 34 y.o. G1P0 at [redacted]w[redacted]d being seen today for ongoing prenatal care.  She is currently monitored for the following issues for this low-risk pregnancy and has Supervision of normal first pregnancy; Obesity affecting pregnancy; Excess weight gain in pregnancy; and Vitamin D deficiency on their problem list.  Patient reports no complaints.  Contractions: Not present. Vag. Bleeding: None.  Movement: Present. Denies leaking of fluid.   The following portions of the patient's history were reviewed and updated as appropriate: allergies, current medications, past family history, past medical history, past social history, past surgical history and problem list. Problem list updated.  Objective:   Vitals:   04/14/19 0921  BP: 116/79  Pulse: 89  Temp: 98.1 F (36.7 C)  Weight: 220 lb (99.8 kg)    Fetal Status: Fetal Heart Rate (bpm): 133 Fundal Height: 29 cm Movement: Present     General:  Alert, oriented and cooperative. Patient is in no acute distress.  Skin: Skin is warm and dry. No rash noted.   Cardiovascular: Normal heart rate noted  Respiratory: Normal respiratory effort, no problems with respiration noted  Abdomen: Soft, gravid, appropriate for gestational age. Pain/Pressure: Absent     Pelvic: Vag. Bleeding: None Vag D/C Character: Thin   Cervical exam deferred        Extremities: Normal range of motion.  Edema: None  Mental Status: Normal mood and affect. Normal behavior. Normal judgment and thought content.   Urinalysis:      Assessment and Plan:  Pregnancy: G1P0 at [redacted]w[redacted]d  1. Encounter for supervision of normal first pregnancy in second trimester - discussed contraception, pt unsure, information given - 2Hr GTT w/ 1 Hr Carpenter 75 g - Vitamin D (25 hydroxy) - HIV antibody (with reflex) - CBC - RPR - Tdap vaccine greater than or equal to 7yo IM  2. Obesity affecting pregnancy in second trimester - pt reports is taking bASA daily - f/u  anatomy scan + growth 05/04/2019, pt aware - baseline preeclampsia labs today - referral to nutrition services for weight gain  Preterm labor symptoms and general obstetric precautions including but not limited to vaginal bleeding, contractions, leaking of fluid and fetal movement were reviewed in detail with the patient. I discussed the assessment and treatment plan with the patient. The patient was provided an opportunity to ask questions and all were answered. The patient agreed with the plan and demonstrated an understanding of the instructions. The patient was advised to call back or seek an in-person office evaluation/go to [facility]U at [facility] for any urgent or concerning symptoms. Please refer to After Visit Summary for other counseling recommendations.  Return in about 2 weeks (around 04/28/2019) for virtual ROB.   Jemaine Prokop, Odie Sera, NP

## 2019-04-15 LAB — COMPREHENSIVE METABOLIC PANEL
AG Ratio: 1.2 (calc) (ref 1.0–2.5)
ALT: 16 U/L (ref 6–29)
AST: 19 U/L (ref 10–30)
Albumin: 3.6 g/dL (ref 3.6–5.1)
Alkaline phosphatase (APISO): 68 U/L (ref 31–125)
BUN: 8 mg/dL (ref 7–25)
CO2: 22 mmol/L (ref 20–32)
Calcium: 9.2 mg/dL (ref 8.6–10.2)
Chloride: 104 mmol/L (ref 98–110)
Creat: 0.52 mg/dL (ref 0.50–1.10)
Globulin: 2.9 g/dL (calc) (ref 1.9–3.7)
Glucose, Bld: 81 mg/dL (ref 65–99)
Potassium: 4.2 mmol/L (ref 3.5–5.3)
Sodium: 134 mmol/L — ABNORMAL LOW (ref 135–146)
Total Bilirubin: 0.3 mg/dL (ref 0.2–1.2)
Total Protein: 6.5 g/dL (ref 6.1–8.1)

## 2019-04-15 LAB — PROTEIN / CREATININE RATIO, URINE
Creatinine, Urine: 105 mg/dL (ref 20–275)
Protein/Creat Ratio: 133 mg/g creat (ref 21–161)
Protein/Creatinine Ratio: 0.133 mg/mg creat (ref 0.021–0.16)
Total Protein, Urine: 14 mg/dL (ref 5–24)

## 2019-04-17 LAB — CBC
HCT: 36.6 % (ref 35.0–45.0)
Hemoglobin: 12.2 g/dL (ref 11.7–15.5)
MCH: 29.8 pg (ref 27.0–33.0)
MCHC: 33.3 g/dL (ref 32.0–36.0)
MCV: 89.5 fL (ref 80.0–100.0)
MPV: 11.7 fL (ref 7.5–12.5)
Platelets: 206 10*3/uL (ref 140–400)
RBC: 4.09 10*6/uL (ref 3.80–5.10)
RDW: 13.2 % (ref 11.0–15.0)
WBC: 5.7 10*3/uL (ref 3.8–10.8)

## 2019-04-17 LAB — 2HR GTT W 1 HR, CARPENTER, 75 G
Glucose, 1 Hr, Gest: 121 mg/dL (ref 65–179)
Glucose, 2 Hr, Gest: 118 mg/dL (ref 65–152)
Glucose, Fasting, Gest: 83 mg/dL (ref 65–91)

## 2019-04-17 LAB — RPR: RPR Ser Ql: NONREACTIVE

## 2019-04-17 LAB — HIV ANTIBODY (ROUTINE TESTING W REFLEX): HIV 1&2 Ab, 4th Generation: NONREACTIVE

## 2019-04-17 LAB — VITAMIN D 25 HYDROXY (VIT D DEFICIENCY, FRACTURES): Vit D, 25-Hydroxy: 26 ng/mL — ABNORMAL LOW (ref 30–100)

## 2019-04-18 ENCOUNTER — Other Ambulatory Visit: Payer: Self-pay

## 2019-04-18 ENCOUNTER — Ambulatory Visit (INDEPENDENT_AMBULATORY_CARE_PROVIDER_SITE_OTHER): Payer: Medicaid Other

## 2019-04-18 DIAGNOSIS — Z23 Encounter for immunization: Secondary | ICD-10-CM | POA: Diagnosis not present

## 2019-04-18 DIAGNOSIS — Z3483 Encounter for supervision of other normal pregnancy, third trimester: Secondary | ICD-10-CM | POA: Diagnosis not present

## 2019-04-18 DIAGNOSIS — Z3402 Encounter for supervision of normal first pregnancy, second trimester: Secondary | ICD-10-CM

## 2019-04-18 DIAGNOSIS — Z3480 Encounter for supervision of other normal pregnancy, unspecified trimester: Secondary | ICD-10-CM

## 2019-04-18 NOTE — Progress Notes (Signed)
Pt here for Tdap

## 2019-04-25 ENCOUNTER — Other Ambulatory Visit: Payer: Self-pay

## 2019-04-25 ENCOUNTER — Ambulatory Visit: Payer: Medicaid Other | Admitting: Registered"

## 2019-04-25 ENCOUNTER — Encounter: Payer: Medicaid Other | Attending: Women's Health | Admitting: Registered"

## 2019-04-25 DIAGNOSIS — O2600 Excessive weight gain in pregnancy, unspecified trimester: Secondary | ICD-10-CM | POA: Insufficient documentation

## 2019-04-25 DIAGNOSIS — E559 Vitamin D deficiency, unspecified: Secondary | ICD-10-CM | POA: Diagnosis not present

## 2019-04-25 DIAGNOSIS — Z713 Dietary counseling and surveillance: Secondary | ICD-10-CM | POA: Insufficient documentation

## 2019-04-25 DIAGNOSIS — Z3A00 Weeks of gestation of pregnancy not specified: Secondary | ICD-10-CM | POA: Diagnosis not present

## 2019-04-25 DIAGNOSIS — O99280 Endocrine, nutritional and metabolic diseases complicating pregnancy, unspecified trimester: Secondary | ICD-10-CM | POA: Diagnosis not present

## 2019-04-25 NOTE — Progress Notes (Signed)
Medical Nutrition Therapy:  Appt start time: 1130 end time:  1215.   Assessment:  Primary concerns today: Patient was referred for excess weight gain in pregnancy.  Patient states she has gained about 20 lbs so far in pregnancy. Pt reports that prior to pregnancy she was in [place] and had lost about 20 lbs by following a keto diet and working outfrequently. Pt states since being back in the states with the change in food environment as well as coworkers encouraging her to go out to eat she has stopped the keto diet and greatly reduced her physical activity. 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  Pt states she lives with her mother who has T2DM and manages her diabetes well and has been encouraging Pt to reduce intake of sweetened beverages. Pt states her husband is a Optometrist and encourages her to stay active. Pt states in the summer she enjoys hiking [place] [place].   Patient states she has been more tired than usual lately. Pt continues to have low vitamin D levels but they have come up since taking 4,000 units of OTC supplement.  Preferred Learning Style:   No preference indicated   Learning Readiness:   Contemplating  MEDICATIONS: reviewed   DIETARY INTAKE:  Usual eating pattern includes 2 meals and 0-2 snacks per day..    24-hr recall:  B ( AM): oatmeal, 1% milk Snk ( AM):   L ( PM):  Snk ( PM):  D ( PM): Chad African food - stew with meat & vegetables with fufu OR chick-fil-a grilled chicken sandwich, fries, sweet tea/lemonade Snk ( PM): none Beverages: water, 2-3 x/week sweet tea/lemonade; juice  Usual physical activity: 2-3x/week walks 1.5 miles  Progress Towards Goal(s):  New goals.   Nutritional Diagnosis:  NI-5.8.2 Excessive carbohydrate intake As related to sweet tea/lemonade, maybe fufu and fries.  As evidenced by dietary recall.    Intervention:  Nutrition  Education. Discussed balanced eating. Discussed calories from beverages vs from food. Discussed keto diet and when might be appropriate. Discussed benefits physical activity.  Goals: Reduce intake of sweetened beverages Be aware of carbohydrates in diet and balance with protein and vegetables. Continue staying active and increase as energy and pregnancy allows. Avoid weight cycling that comes with dieting to avoid inflammation and stress on the body.  Teaching Method Utilized:  Visual Auditory  Handouts given during visit include:  Choose MyPlate: Eating for a Healthy Baby  Barriers to learning/adherence to lifestyle change: none  Demonstrated degree of understanding via:  Teach Back   Monitoring/Evaluation:  Dietary intake, exercise, and body weight prn.

## 2019-04-27 ENCOUNTER — Other Ambulatory Visit: Payer: Self-pay

## 2019-04-27 ENCOUNTER — Other Ambulatory Visit: Payer: Self-pay | Admitting: *Deleted

## 2019-04-27 DIAGNOSIS — E559 Vitamin D deficiency, unspecified: Secondary | ICD-10-CM

## 2019-04-27 DIAGNOSIS — O2600 Excessive weight gain in pregnancy, unspecified trimester: Secondary | ICD-10-CM

## 2019-05-02 ENCOUNTER — Telehealth (INDEPENDENT_AMBULATORY_CARE_PROVIDER_SITE_OTHER): Payer: Medicaid Other | Admitting: Advanced Practice Midwife

## 2019-05-02 ENCOUNTER — Encounter: Payer: Self-pay | Admitting: Advanced Practice Midwife

## 2019-05-02 DIAGNOSIS — O99212 Obesity complicating pregnancy, second trimester: Secondary | ICD-10-CM | POA: Diagnosis not present

## 2019-05-02 DIAGNOSIS — O2602 Excessive weight gain in pregnancy, second trimester: Secondary | ICD-10-CM | POA: Diagnosis not present

## 2019-05-02 DIAGNOSIS — Z3402 Encounter for supervision of normal first pregnancy, second trimester: Secondary | ICD-10-CM

## 2019-05-02 DIAGNOSIS — E559 Vitamin D deficiency, unspecified: Secondary | ICD-10-CM

## 2019-05-02 NOTE — Progress Notes (Signed)
   TELEHEALTH VIRTUAL OBSTETRICS VISIT ENCOUNTER NOTE  I connected with Hannah Crosby on 05/02/19 at  9:55 AM EDT by telephone at home and verified that I am speaking with the correct person using two identifiers.   I discussed the limitations, risks, security and privacy concerns of performing an evaluation and management service by telephone and the availability of in person appointments. I also discussed with the patient that there may be a patient responsible charge related to this service. The patient expressed understanding and agreed to proceed.  Subjective:  Hannah Crosby is a 34 y.o. G1P0 at [redacted]w[redacted]d being followed for ongoing prenatal care.  She is currently monitored for the following issues for this low-risk pregnancy and has Supervision of normal first pregnancy; Obesity affecting pregnancy; Excess weight gain in pregnancy; and Vitamin D deficiency on their problem list.  Patient reports no complaints. Reports fetal movement. Denies any contractions, bleeding or leaking of fluid.   The following portions of the patient's history were reviewed and updated as appropriate: allergies, current medications, past family history, past medical history, past social history, past surgical history and problem list.   Objective:   General:  Alert, oriented and cooperative.   Mental Status: Normal mood and affect perceived. Normal judgment and thought content.  Rest of physical exam deferred due to type of encounter  Assessment and Plan:  Pregnancy: G1P0 at [redacted]w[redacted]d 1. Vitamin D deficiency   2. Encounter for supervision of normal first pregnancy in second trimester --BP normal today --Pt reports good fetal movement, denies cramping, LOF, or vaginal bleeding --Anticipatory guidance about next visits/weeks of pregnancy given. --Next visit in 4 weeks virtual, then 36 weeks in the office  3. Obesity affecting pregnancy in second trimester   4. Excessive weight gain during pregnancy in  second trimester   Preterm labor symptoms and general obstetric precautions including but not limited to vaginal bleeding, contractions, leaking of fluid and fetal movement were reviewed in detail with the patient.  I discussed the assessment and treatment plan with the patient. The patient was provided an opportunity to ask questions and all were answered. The patient agreed with the plan and demonstrated an understanding of the instructions. The patient was advised to call back or seek an in-person office evaluation/go to MAU at [facility] for any urgent or concerning symptoms. Please refer to After Visit Summary for other counseling recommendations.   I provided 10 minutes of face-to-face time during this encounter.  Return in about 4 weeks (around 05/30/2019).  Future Appointments  Date Time Provider Department Center  05/04/2019 12:45 PM [facility] NURSE [facility]-MFC MFC-US  05/04/2019 12:45 PM [facility] [place] 5 WH-MFCUS MFC-US    Sharen Counter, CNM Center for [employer], [facility]

## 2019-05-04 ENCOUNTER — Other Ambulatory Visit: Payer: Self-pay

## 2019-05-04 ENCOUNTER — Encounter (HOSPITAL_COMMUNITY): Payer: Self-pay

## 2019-05-04 ENCOUNTER — Ambulatory Visit (HOSPITAL_COMMUNITY): Payer: Medicaid Other | Admitting: *Deleted

## 2019-05-04 ENCOUNTER — Ambulatory Visit (HOSPITAL_COMMUNITY)
Admission: RE | Admit: 2019-05-04 | Discharge: 2019-05-04 | Disposition: A | Discharge status: Home / Self Care | Payer: Medicaid Other | Source: Ambulatory Visit | Attending: Obstetrics and Gynecology | Admitting: Obstetrics and Gynecology

## 2019-05-04 DIAGNOSIS — E559 Vitamin D deficiency, unspecified: Secondary | ICD-10-CM | POA: Diagnosis present

## 2019-05-04 DIAGNOSIS — O99213 Obesity complicating pregnancy, third trimester: Secondary | ICD-10-CM

## 2019-05-04 DIAGNOSIS — Z362 Encounter for other antenatal screening follow-up: Secondary | ICD-10-CM | POA: Diagnosis not present

## 2019-05-04 DIAGNOSIS — Z3A30 30 weeks gestation of pregnancy: Secondary | ICD-10-CM

## 2019-05-04 DIAGNOSIS — E669 Obesity, unspecified: Secondary | ICD-10-CM | POA: Diagnosis not present

## 2019-05-25 ENCOUNTER — Telehealth: Payer: Self-pay | Admitting: *Deleted

## 2019-05-25 DIAGNOSIS — O99212 Obesity complicating pregnancy, second trimester: Secondary | ICD-10-CM

## 2019-05-25 DIAGNOSIS — Z3402 Encounter for supervision of normal first pregnancy, second trimester: Secondary | ICD-10-CM

## 2019-05-25 MED ORDER — ASPIRIN 81 MG PO CHEW: 81.0000 mg | CHEWABLE_TABLET | Freq: Every day | ORAL | 3 refills | Status: DC

## 2019-05-25 NOTE — Telephone Encounter (Signed)
Pt called requesting a RF on her ASA 81 mg be sent to [facility] [address]  RF request sent.

## 2019-05-30 ENCOUNTER — Telehealth (INDEPENDENT_AMBULATORY_CARE_PROVIDER_SITE_OTHER): Payer: Medicaid Other | Admitting: Advanced Practice Midwife

## 2019-05-30 ENCOUNTER — Encounter: Payer: Self-pay | Admitting: Advanced Practice Midwife

## 2019-05-30 VITALS — BP 131/77 | HR 97

## 2019-05-30 DIAGNOSIS — Z3400 Encounter for supervision of normal first pregnancy, unspecified trimester: Secondary | ICD-10-CM

## 2019-05-30 DIAGNOSIS — O2600 Excessive weight gain in pregnancy, unspecified trimester: Secondary | ICD-10-CM

## 2019-05-30 DIAGNOSIS — O479 False labor, unspecified: Secondary | ICD-10-CM

## 2019-05-30 DIAGNOSIS — O99213 Obesity complicating pregnancy, third trimester: Secondary | ICD-10-CM

## 2019-05-30 NOTE — Progress Notes (Addendum)
OBSTETRICS PRENATAL VIRTUAL VISIT ENCOUNTER NOTE  Provider location: [facility] [place]   I connected with Hannah Crosby on 05/30/19 at  9:55 AM EDT by MyChart Video Encounter at home and verified that I am speaking with the correct person using two identifiers.   I discussed the limitations, risks, security and privacy concerns of performing an evaluation and management service virtually and the availability of in person appointments. I also discussed with the patient that there may be a patient responsible charge related to this service. The patient expressed understanding and agreed to proceed. Subjective:  Hannah Crosby is a 34 y.o. G1P0 at [redacted]w[redacted]d being seen today for ongoing prenatal care.  She is currently monitored for the following issues for this low-risk pregnancy and has Supervision of normal first pregnancy; Obesity affecting pregnancy; Excess weight gain in pregnancy; and Vitamin D deficiency on their problem list.  Patient reports occasional contractions.  Contractions: Irritability. Vag. Bleeding: None.  Movement: Present. Denies any leaking of fluid.   The following portions of the patient's history were reviewed and updated as appropriate: allergies, current medications, past family history, past medical history, past social history, past surgical history and problem list.   Objective:   Vitals:   05/30/19 1022  BP: 131/77  Pulse: 97    Fetal Status:     Movement: Present     General:  Alert, oriented and cooperative. Patient is in no acute distress.  Respiratory: Normal respiratory effort, no problems with respiration noted  Mental Status: Normal mood and affect. Normal behavior. Normal judgment and thought content.  Rest of physical exam deferred due to type of encounter  Imaging: [place] MFM OB FOLLOW UP  Result Date: 05/04/2019 ----------------------------------------------------------------------  OBSTETRICS REPORT                        (Signed Final 05/04/2019 01:31 pm) ---------------------------------------------------------------------- Patient Info  ID #:       [id number]                          D.O.B.:  02-11-1985 (34 yrs)  Name:       Hannah Crosby               Visit Date: 05/04/2019 12:54 pm ---------------------------------------------------------------------- Performed By  Performed By:     Percell Boston          Ref. Address:     [address]                                                             [place], [place]  [place]  Attending:        Johnell Comings MD         [facility]  Referred By:      Graciela Husbands CNM ---------------------------------------------------------------------- Orders   #  Description                          Code         Ordered By   1  [place] MFM OB FOLLOW UP                  [phone]     [facility]  ----------------------------------------------------------------------   #  Order #                    Accession #                 Episode #   1  [id number]                  [id number]                  [id number]  ---------------------------------------------------------------------- Indications   Obesity complicating pregnancy, third          O99.213   trimester   [redacted] weeks gestation of pregnancy                Z3A.30   Encounter for other antenatal screening        Z36.2   follow-up   Negative Horizon  ---------------------------------------------------------------------- Vital Signs                                                 Height:        5'2" ---------------------------------------------------------------------- Fetal Evaluation  Num Of Fetuses:         1  Fetal Heart Rate(bpm):  128  Cardiac Activity:       Observed  Presentation:            Cephalic  Placenta:               Anterior  P. Cord Insertion:      Previously Visualized  Amniotic Fluid  AFI FV:      Within normal limits  AFI Sum(cm)     %Tile       Largest Pocket(cm)  13.08           39          4.2  RUQ(cm)       RLQ(cm)       LUQ(cm)        LLQ(cm)  2.09          3.56          4.2  3.23 ---------------------------------------------------------------------- Biometry  BPD:      73.2  mm     G. Age:  29w 3d         10  %    CI:        73.54   %    70 - 86                                                          FL/HC:      22.4   %    19.3 - 21.3  HC:      271.2  mm     G. Age:  29w 4d          3  %    HC/AC:      0.98        0.96 - 1.17  AC:       276   mm     G. Age:  31w 5d         77  %    FL/BPD:     82.9   %    71 - 87  FL:       60.7  mm     G. Age:  31w 4d         64  %    FL/AC:      22.0   %    20 - 24  Est. FW:    1722  gm    3 lb 13 oz      60  % ---------------------------------------------------------------------- OB History  Gravidity:    1 ---------------------------------------------------------------------- Gestational Age  LMP:           30w 4d        Date:  10/02/18                 EDD:   07/09/19  U/S Today:     30w 4d                                        EDD:   07/09/19  Best:          30w 4d     Det. By:  LMP  (10/02/18)          EDD:   07/09/19 ---------------------------------------------------------------------- Anatomy  Cranium:               Appears normal         LVOT:                   Previously seen  Cavum:                 Previously seen        Aortic Arch:            Previously seen  Ventricles:            Appears normal         Ductal Arch:            Previously seen  Choroid Plexus:        Previously seen        Diaphragm:  Previously seen  Cerebellum:            Previously seen        Stomach:                Appears normal, left                                                                        sided  Posterior Fossa:        Previously seen        Abdomen:                Previously seen  Nuchal Fold:           Not applicable (>20    Abdominal Wall:         Previously seen                         wks GA)  Face:                  Orbits and profile     Cord Vessels:           Previously seen                         previously seen  Lips:                  Previously seen        Kidneys:                Appear normal  Palate:                Not well visualized    Bladder:                Appears normal  Thoracic:              Appears normal         Spine:                  Appears normal  Heart:                 Appears normal         Upper Extremities:      Previously seen                         (4CH, axis, and                         situs)  RVOT:                  Previously seen        Lower Extremities:      Previously seen  Other:  Female gender previously seen. Heels and RT 5th digit visualized          previously. Nasal bone visualized previously. Open hands visualized          previously. Marland Kitchen ---------------------------------------------------------------------- Cervix Uterus Adnexa  Cervix  Not visualized (advanced GA >24wks)  Uterus  No abnormality visualized.  Left Ovary  No adnexal mass visualized.  Right Ovary  No adnexal mass visualized.  Cul De Sac  No free fluid seen.  Adnexa  No abnormality visualized. ---------------------------------------------------------------------- Comments  This patient was seen for a follow up exam as the views of  the fetal spine were unable to be fully visualized during her  last exam.  She denies any problems since her last exam.  She was informed that the fetal growth and amniotic fluid  level appears appropriate for her gestational age.  The views of the fetal spine were visualized today.  There  were no obvious spine abnormalities noted.  The limitations  of ultrasound in the detection of all anomalies was discussed.  Follow-up as indicated.  ----------------------------------------------------------------------                   Ma RingsVictor Fang, MD Electronically Signed Final Report   05/04/2019 01:31 pm ----------------------------------------------------------------------   Assessment and Plan:  Pregnancy: G1P0 at 6867w2d  1. Supervision of normal first pregnancy, antepartum --Anticipatory guidance about next visits/weeks of pregnancy given. --Next visit in 2 weeks, in office at 36 weeks for GBS   2. Obesity affecting pregnancy in third trimester  3. Excessive weight gain during pregnancy, antepartum  4. Braxton Hicks contractions --Episode of painful contractions 3 days ago, resolved with rest.  --PTL precautions reviewed  Preterm labor symptoms and general obstetric precautions including but not limited to vaginal bleeding, contractions, leaking of fluid and fetal movement were reviewed in detail with the patient. I discussed the assessment and treatment plan with the patient. The patient was provided an opportunity to ask questions and all were answered. The patient agreed with the plan and demonstrated an understanding of the instructions. The patient was advised to call back or seek an in-person office evaluation/[facility] for any urgent or concerning symptoms. Please refer to After Visit Summary for other counseling recommendations.   I provided 10 minutes of face-to-face time during this encounter.  No follow-ups on file.  Future Appointments  Date Time Provider Department Center  06/13/2019 10:15 AM Sharyon Cableogers, Veronica C, CNM [facility]    Sharen CounterLisa Leftwich-Kirby, CNM [facility], [facility]

## 2019-06-13 ENCOUNTER — Ambulatory Visit (INDEPENDENT_AMBULATORY_CARE_PROVIDER_SITE_OTHER): Payer: Medicaid Other | Admitting: Certified Nurse Midwife

## 2019-06-13 ENCOUNTER — Other Ambulatory Visit: Payer: Self-pay

## 2019-06-13 ENCOUNTER — Encounter: Payer: Self-pay | Admitting: Certified Nurse Midwife

## 2019-06-13 ENCOUNTER — Other Ambulatory Visit (HOSPITAL_COMMUNITY)
Admission: RE | Admit: 2019-06-13 | Discharge: 2019-06-13 | Disposition: A | Discharge status: Home / Self Care | Payer: Medicaid Other | Source: Ambulatory Visit | Attending: Certified Nurse Midwife | Admitting: Certified Nurse Midwife

## 2019-06-13 VITALS — BP 112/71 | HR 104 | Wt 227.0 lb

## 2019-06-13 DIAGNOSIS — E559 Vitamin D deficiency, unspecified: Secondary | ICD-10-CM

## 2019-06-13 DIAGNOSIS — Z3403 Encounter for supervision of normal first pregnancy, third trimester: Secondary | ICD-10-CM | POA: Diagnosis not present

## 2019-06-13 DIAGNOSIS — O99210 Obesity complicating pregnancy, unspecified trimester: Secondary | ICD-10-CM

## 2019-06-13 LAB — OB RESULTS CONSOLE GBS: GBS: NEGATIVE

## 2019-06-13 NOTE — Progress Notes (Signed)
   PRENATAL VISIT NOTE  Subjective:  Hannah Crosby is a 34 y.o. G1P0 at [redacted]w[redacted]d being seen today for ongoing prenatal care.  She is currently monitored for the following issues for this low-risk pregnancy and has Supervision of normal first pregnancy; Obesity affecting pregnancy; Excess weight gain in pregnancy; and Vitamin D deficiency on their problem list.  Patient reports no complaints.  Contractions: Not present. Vag. Bleeding: None.  Movement: Present. Denies leaking of fluid.   The following portions of the patient's history were reviewed and updated as appropriate: allergies, current medications, past family history, past medical history, past social history, past surgical history and problem list.   Objective:   Vitals:   06/13/19 1027  BP: 112/71  Pulse: (!) 104  Weight: 227 lb (103 kg)    Fetal Status: Fetal Heart Rate (bpm): 131 Fundal Height: 37 cm Movement: Present  Presentation: Vertex  General:  Alert, oriented and cooperative. Patient is in no acute distress.  Skin: Skin is warm and dry. No rash noted.   Cardiovascular: Normal heart rate noted  Respiratory: Normal respiratory effort, no problems with respiration noted  Abdomen: Soft, gravid, appropriate for gestational age.  Pain/Pressure: Present     Pelvic: Cervical exam performed in the presence of a chaperone Dilation: Closed Effacement (%): Thick Station: Ballotable  Extremities: Normal range of motion.  Edema: None  Mental Status: Normal mood and affect. Normal behavior. Normal judgment and thought content.   Assessment and Plan:  Pregnancy: G1P0 at [redacted]w[redacted]d 1. Encounter for supervision of normal first pregnancy in third trimester - Patient doing well, no complaints - Routine prenatal care - Anticipatory guidance on upcoming appointments including next visit mychart appointment - Educated and discussed use of EPO, RRT for cervical ripening at home  - Labor precautions and reasons to present to MAU discussed    - Culture, beta strep (group b only) - Cervicovaginal ancillary only( Donna)  2. Obesity affecting pregnancy, antepartum  3. Vitamin D deficiency  Preterm labor symptoms and general obstetric precautions including but not limited to vaginal bleeding, contractions, leaking of fluid and fetal movement were reviewed in detail with the patient. Please refer to After Visit Summary for other counseling recommendations.   Return in about 2 weeks (around 06/27/2019) for ROB-mychart.  Future Appointments  Date Time Provider Department Center  06/27/2019 10:15 AM Leftwich-Kirby, Wilmer Floor, CNM [facility]    Sharyon Cable, CNM

## 2019-06-13 NOTE — Patient Instructions (Addendum)
Reasons to go to MAU:  1.  Contractions are  5 minutes apart or less, each last 1 minute, these have been going on for 1-2 hours, and you cannot walk or talk during them 2.  You have a large gush of fluid, or a trickle of fluid that will not stop and you have to wear a pad 3.  You have bleeding that is bright red, heavier than spotting--like menstrual bleeding (spotting can be normal in early labor or after a check of your cervix) 4.  You do not feel the baby moving like he/she normally does  Cervical Ripening: May try one or both  Red Raspberry Leaf capsules:  two 300mg  or 400mg  tablets with each meal, 2-3 times a day  Potential Side Effects Of Raspberry Leaf:  Most women do not experience any side effects from drinking raspberry leaf tea. However, nausea and loose stools are possible   Evening Primrose Oil capsules: may take 1 to 3 capsules daily. May also prick one to release the oil and insert it into your vagina at night.  Some of the potential side effects:  Upset stomach  Loose stools or diarrhea  Headaches  Nausea:

## 2019-06-14 LAB — CERVICOVAGINAL ANCILLARY ONLY
Chlamydia: NEGATIVE
Comment: NEGATIVE
Comment: NORMAL
Neisseria Gonorrhea: NEGATIVE

## 2019-06-16 LAB — CULTURE, BETA STREP (GROUP B ONLY)
MICRO NUMBER:: 10437762
SPECIMEN QUALITY:: ADEQUATE

## 2019-06-27 ENCOUNTER — Telehealth (INDEPENDENT_AMBULATORY_CARE_PROVIDER_SITE_OTHER): Payer: Medicaid Other | Admitting: Advanced Practice Midwife

## 2019-06-27 VITALS — BP 136/89 | HR 92

## 2019-06-27 DIAGNOSIS — O26899 Other specified pregnancy related conditions, unspecified trimester: Secondary | ICD-10-CM

## 2019-06-27 DIAGNOSIS — Z3A38 38 weeks gestation of pregnancy: Secondary | ICD-10-CM

## 2019-06-27 DIAGNOSIS — Z3400 Encounter for supervision of normal first pregnancy, unspecified trimester: Secondary | ICD-10-CM

## 2019-06-27 DIAGNOSIS — O99210 Obesity complicating pregnancy, unspecified trimester: Secondary | ICD-10-CM

## 2019-06-27 DIAGNOSIS — G5600 Carpal tunnel syndrome, unspecified upper limb: Secondary | ICD-10-CM

## 2019-06-27 MED ORDER — WRIST BRACE/RIGHT SMALL MISC: 1.0000 | Freq: Every day | 0 refills | Status: DC

## 2019-06-27 MED ORDER — WRIST BRACE/LEFT SMALL MISC: 1.0000 | Freq: Every day | 0 refills | Status: DC

## 2019-06-27 NOTE — Progress Notes (Signed)
OBSTETRICS PRENATAL VIRTUAL VISIT ENCOUNTER NOTE  Provider location: [facility] [place]   I connected with [name] on 06/27/19 at 10:15 AM EDT by MyChart Video Encounter at home and verified that I am speaking with the correct person using two identifiers.   I discussed the limitations, risks, security and privacy concerns of performing an evaluation and management service virtually and the availability of in person appointments. I also discussed with the patient that there may be a patient responsible charge related to this service. The patient expressed understanding and agreed to proceed. Subjective:  Hannah Crosby is a 34 y.o. G1P0 at [redacted]w[redacted]d being seen today for ongoing prenatal care.  She is currently monitored for the following issues for this low-risk pregnancy and has Supervision of normal first pregnancy; Obesity affecting pregnancy; Excess weight gain in pregnancy; and Vitamin D deficiency on their problem list.  Patient reports Wrist pain x 2.  Contractions: Not present. Vag. Bleeding: None.  Movement: Present. Denies any leaking of fluid.   The following portions of the patient's history were reviewed and updated as appropriate: allergies, current medications, past family history, past medical history, past social history, past surgical history and problem list.   Objective:   Vitals:   06/27/19 1013  BP: 136/89  Pulse: 92    Fetal Status:     Movement: Present     General:  Alert, oriented and cooperative. Patient is in no acute distress.  Respiratory: Normal respiratory effort, no problems with respiration noted  Mental Status: Normal mood and affect. Normal behavior. Normal judgment and thought content.  Rest of physical exam deferred due to type of encounter  Imaging: No results found.  Assessment and Plan:  Pregnancy: G1P0 at [redacted]w[redacted]d 1. Supervision of normal first pregnancy, antepartum --Pt reports good fetal movement,  denies cramping, LOF, or vaginal bleeding --Anticipatory guidance about next visits/weeks of pregnancy given. --Reviewed BP, wnl but 130s/80s. Pt to take BP 1-2 times before next visit, report any pressures above 140/90. --Reviewed labor readiness/ways to encourage labor onset including the [employer], EPO, red raspberry leaf tea, and intercourse.  --Next visit in office in 1 week  2. Obesity affecting pregnancy, antepartum   3. Carpal tunnel syndrome during pregnancy --Pain in wrists, related to thumb movement so similar to carpal tunnel or other inflammatory process.  Never had pain before pregnancy and is worsening in third trimester.  Will try wrist braces for carpal tunnel, ice to area, Tylenol. --Also discussed readiness for labor/ways to encourage labor onset, see above. --Consider referral to ortho if pain persists postpartum - Elastic Bandages & Supports (WRIST BRACE/LEFT SMALL) MISC; 1 Device by Does not apply route daily.  Dispense: 1 each; Refill: 0 - Elastic Bandages & Supports (WRIST BRACE/RIGHT SMALL) MISC; 1 Device by Does not apply route daily.  Dispense: 1 each; Refill: 0  Term labor symptoms and general obstetric precautions including but not limited to vaginal bleeding, contractions, leaking of fluid and fetal movement were reviewed in detail with the patient. I discussed the assessment and treatment plan with the patient. The patient was provided an opportunity to ask questions and all were answered. The patient agreed with the plan and demonstrated an understanding of the instructions. The patient was advised to call back or seek an in-person office evaluation/go to [facility] at [facility] for any urgent or concerning symptoms. Please refer to After Visit Summary for other counseling recommendations.   I provided 10 minutes of  face-to-face time during this encounter.  Return in about 1 week (around 07/04/2019).  Future Appointments  Date Time Provider  [place]  07/06/2019  4:15 PM Anyanwu, Sallyanne Havers, MD [place], [place] for [employer], [place]

## 2019-07-06 ENCOUNTER — Other Ambulatory Visit: Payer: Self-pay

## 2019-07-06 ENCOUNTER — Ambulatory Visit (INDEPENDENT_AMBULATORY_CARE_PROVIDER_SITE_OTHER): Payer: Medicaid Other | Admitting: Obstetrics & Gynecology

## 2019-07-06 ENCOUNTER — Encounter: Payer: Self-pay | Admitting: Obstetrics & Gynecology

## 2019-07-06 VITALS — BP 125/74 | HR 86 | Wt 239.0 lb

## 2019-07-06 DIAGNOSIS — Z3A39 39 weeks gestation of pregnancy: Secondary | ICD-10-CM

## 2019-07-06 DIAGNOSIS — Z3400 Encounter for supervision of normal first pregnancy, unspecified trimester: Secondary | ICD-10-CM

## 2019-07-06 DIAGNOSIS — Z3403 Encounter for supervision of normal first pregnancy, third trimester: Secondary | ICD-10-CM

## 2019-07-06 NOTE — Patient Instructions (Signed)
Return to office for any scheduled appointments. Call the office or go to the MAU at [facility] at [facility] if:  You begin to have strong, frequent contractions  Your water breaks.  Sometimes it is a big gush of fluid, sometimes it is just a trickle that keeps getting your panties wet or running down your legs  You have vaginal bleeding.  It is normal to have a small amount of spotting if your cervix was checked.   You do not feel your baby moving like normal.  If you do not, get something to eat and drink and lay down and focus on feeling your baby move.   If your baby is still not moving like normal, you should call the office or go to MAU.  Any other obstetric concerns.    Cervical Ripening: May try one or all:  Red Raspberry Leaf capsules:  two 300mg  or 400mg  tablets with each meal, 2-3 times a day  Potential Side Effects Of Raspberry Leaf:  Most women do not experience any side effects from drinking raspberry leaf tea. However, nausea and loose stools are possible    Evening Primrose Oil capsules: may take 1 to 3 capsules daily. May also prick one to release the oil and insert it into your vagina at night.  Some of the potential side effects:  Upset stomach  Loose stools or diarrhea  Headaches  Nausea:    4 Dates a day (may taste better if warmed in microwave until soft)     Labor Induction  Labor induction is when steps are taken to cause a pregnant woman to begin the labor process. Most women go into labor on their own between 37 weeks and 42 weeks of pregnancy. When this does not happen or when there is a medical need for labor to begin, steps may be taken to induce labor. Labor induction causes a pregnant woman's uterus to contract. It also causes the cervix to soften (ripen), open (dilate), and thin out (efface). Usually, labor is not induced before 39 to 42 weeks of pregnancy unless there is a medical reason to do so. Your health care provider will  determine if labor induction is needed. Before inducing labor, your health care provider will consider a number of factors, including:  Your medical condition and your baby's.  How many weeks along you are in your pregnancy.  How mature your baby's lungs are.  The condition of your cervix.  The position of your baby.  The size of your birth canal. What are some reasons for labor induction? Labor may be induced if:  Your health or your baby's health is at risk.  Your pregnancy is overdue by 1 week or more.  Your water breaks but labor does not start on its own.  There is a low amount of amniotic fluid around your baby. You may also choose (elect) to have labor induced at a certain time. Generally, elective labor induction is done no earlier than 39 weeks of pregnancy. What methods are used for labor induction? Methods used for labor induction include:  Prostaglandin medicine. This medicine starts contractions and causes the cervix to dilate and ripen. It can be taken by mouth (orally) or by being inserted into the vagina (suppository).  Inserting a small, thin tube (catheter) with a balloon into the vagina and then expanding the balloon with water to dilate the cervix.  Stripping the membranes. In this method, your health care provider gently separates amniotic  sac tissue from the cervix. This causes the cervix to stretch, which in turn causes the release of a hormone called progesterone. The hormone causes the uterus to contract. This procedure is often done during an office visit, after which you will be sent home to wait for contractions to begin.  Breaking the water. In this method, your health care provider uses a small instrument to make a small hole in the amniotic sac. This eventually causes the amniotic sac to break. Contractions should begin after a few hours.  Medicine to trigger or strengthen contractions. This medicine is given through an IV that is inserted into a  vein in your arm. Except for membrane stripping, which can be done in a clinic, labor induction is done in the hospital so that you and your baby can be carefully monitored. How long does it take for labor to be induced? The length of time it takes to induce labor depends on how ready your body is for labor. Some inductions can take up to 2-3 days, while others may take less than a day. Induction may take longer if:  You are induced early in your pregnancy.  It is your first pregnancy.  Your cervix is not ready. What are some risks associated with labor induction? Some risks associated with labor induction include:  Changes in fetal heart rate, such as being too high, too low, or irregular (erratic).  Failed induction.  Infection in the mother or the baby.  Increased risk of having a cesarean delivery.  Fetal death.  Breaking off (abruption) of the placenta from the uterus (rare).  Rupture of the uterus (very rare). When induction is needed for medical reasons, the benefits of induction generally outweigh the risks. What are some reasons for not inducing labor? Labor induction should not be done if:  Your baby does not tolerate contractions.  You have had previous surgeries on your uterus, such as a myomectomy, removal of fibroids, or a vertical scar from a previous cesarean delivery.  Your placenta lies very low in your uterus and blocks the opening of the cervix (placenta previa).  Your baby is not in a head-down position.  The umbilical cord drops down into the birth canal in front of the baby.  There are unusual circumstances, such as the baby being very early (premature).  You have had more than 2 previous cesarean deliveries. Summary  Labor induction is when steps are taken to cause a pregnant woman to begin the labor process.  Labor induction causes a pregnant woman's uterus to contract. It also causes the cervix to ripen, dilate, and efface.  Labor is not  induced before 39 weeks of pregnancy unless there is a medical reason to do so.  When induction is needed for medical reasons, the benefits of induction generally outweigh the risks. This information is not intended to replace advice given to you by your health care provider. Make sure you discuss any questions you have with your health care provider. Document Revised: 01/29/2017 Document Reviewed: 03/11/2016 Elsevier Patient Education  2020 [employer].

## 2019-07-06 NOTE — Progress Notes (Signed)
   PRENATAL VISIT NOTE  Subjective:  Hannah Crosby is a 34 y.o. G1P0 at [redacted]w[redacted]d being seen today for ongoing prenatal care.  She is currently monitored for the following issues for this high-risk pregnancy and has Supervision of normal first pregnancy; Obesity affecting pregnancy; Excess weight gain in pregnancy; and Vitamin D deficiency on their problem list.  Patient reports no complaints.  Contractions: Not present. Vag. Bleeding: None.  Movement: Present. Denies leaking of fluid.   The following portions of the patient's history were reviewed and updated as appropriate: allergies, current medications, past family history, past medical history, past social history, past surgical history and problem list.   Objective:   Vitals:   07/06/19 1618  BP: 125/74  Pulse: 86  Weight: 239 lb (108.4 kg)    Fetal Status: Fetal Heart Rate (bpm): 145 Fundal Height: 39 cm Movement: Present  Presentation: Vertex  General:  Alert, oriented and cooperative. Patient is in no acute distress.  Skin: Skin is warm and dry. No rash noted.   Cardiovascular: Normal heart rate noted  Respiratory: Normal respiratory effort, no problems with respiration noted  Abdomen: Soft, gravid, appropriate for gestational age.  Pain/Pressure: Present     Pelvic: Cervical exam deferred Dilation: Closed Effacement (%): Thick Station: Ballotable  Extremities: Normal range of motion.  Edema: None  Mental Status: Normal mood and affect. Normal behavior. Normal judgment and thought content.   Assessment and Plan:  Pregnancy: G1P0 at [redacted]w[redacted]d 1. [redacted] weeks gestation of pregnancy 2. Supervision of normal first pregnancy, antepartum Postdates testing to be done next week. Discussed IOL at 41 weeks, also talked about need for post term testing.  This was scheduled, orders have been signed and held.  Term labor symptoms and general obstetric precautions including but not limited to vaginal bleeding, contractions, leaking of fluid  and fetal movement were reviewed in detail with the patient. Please refer to After Visit Summary for other counseling recommendations.   Return in about 1 week (around 07/13/2019) for NST, AFI, OFFICE OB Visit.  No future appointments.  Jaynie Collins, MD

## 2019-07-11 ENCOUNTER — Encounter (HOSPITAL_COMMUNITY): Payer: Self-pay | Admitting: *Deleted

## 2019-07-11 ENCOUNTER — Telehealth (HOSPITAL_COMMUNITY): Payer: Self-pay | Admitting: *Deleted

## 2019-07-11 NOTE — Telephone Encounter (Signed)
Preadmission screen  

## 2019-07-13 ENCOUNTER — Ambulatory Visit (INDEPENDENT_AMBULATORY_CARE_PROVIDER_SITE_OTHER): Payer: Medicaid Other | Admitting: *Deleted

## 2019-07-13 ENCOUNTER — Encounter: Payer: Self-pay | Admitting: Obstetrics and Gynecology

## 2019-07-13 ENCOUNTER — Other Ambulatory Visit: Payer: Self-pay

## 2019-07-13 ENCOUNTER — Telehealth (INDEPENDENT_AMBULATORY_CARE_PROVIDER_SITE_OTHER): Payer: Medicaid Other | Admitting: Obstetrics and Gynecology

## 2019-07-13 VITALS — BP 138/88 | HR 93 | Wt 240.0 lb

## 2019-07-13 DIAGNOSIS — Z3A40 40 weeks gestation of pregnancy: Secondary | ICD-10-CM

## 2019-07-13 DIAGNOSIS — O99210 Obesity complicating pregnancy, unspecified trimester: Secondary | ICD-10-CM

## 2019-07-13 DIAGNOSIS — O480 Post-term pregnancy: Secondary | ICD-10-CM

## 2019-07-13 DIAGNOSIS — O99213 Obesity complicating pregnancy, third trimester: Secondary | ICD-10-CM

## 2019-07-13 DIAGNOSIS — Z3400 Encounter for supervision of normal first pregnancy, unspecified trimester: Secondary | ICD-10-CM

## 2019-07-13 NOTE — Progress Notes (Signed)
   OBSTETRICS PRENATAL VIRTUAL VISIT ENCOUNTER NOTE  Provider location: Center for [facility] [place]   I connected with [name] on 07/13/19 at  4:15 PM EDT by MyChart Video Encounter at home and verified that I am speaking with the correct person using two identifiers.   I discussed the limitations, risks, security and privacy concerns of performing an evaluation and management service virtually and the availability of in person appointments. I also discussed with the patient that there may be a patient responsible charge related to this service. The patient expressed understanding and agreed to proceed. Subjective:  Hannah Crosby is a 34 y.o. G1P0 at [redacted]w[redacted]d being seen today for ongoing prenatal care.  She is currently monitored for the following issues for this low-risk pregnancy and has Supervision of normal first pregnancy; Obesity affecting pregnancy; Excess weight gain in pregnancy; and Vitamin D deficiency on their problem list.  Patient reports she is nervous about induction.  Contractions: Irritability. Vag. Bleeding: None.  Movement: Present. Denies any leaking of fluid.   The following portions of the patient's history were reviewed and updated as appropriate: allergies, current medications, past family history, past medical history, past social history, past surgical history and problem list.   Objective:   Vitals:   07/13/19 1138  BP: 138/88  Pulse: 93  Weight: 240 lb (108.9 kg)    Fetal Status: Fetal Heart Rate (bpm): 135   Movement: Present  Presentation: Vertex  General:  Alert, oriented and cooperative. Patient is in no acute distress.  Respiratory: Normal respiratory effort, no problems with respiration noted  Mental Status: Normal mood and affect. Normal behavior. Normal judgment and thought content.  Rest of physical exam deferred due to type of encounter  Imaging: No results found.  Assessment and Plan:  Pregnancy: G1P0 at [redacted]w[redacted]d 1.  Supervision of normal first pregnancy, antepartum Reviewed induction process  2. Obesity affecting pregnancy, antepartum   Term labor symptoms and general obstetric precautions including but not limited to vaginal bleeding, contractions, leaking of fluid and fetal movement were reviewed in detail with the patient. I discussed the assessment and treatment plan with the patient. The patient was provided an opportunity to ask questions and all were answered. The patient agreed with the plan and demonstrated an understanding of the instructions. The patient was advised to call back or seek an in-person office evaluation/go to MAU at [facility] for any urgent or concerning symptoms. Please refer to After Visit Summary for other counseling recommendations.   I provided 12 minutes of face-to-face time during this encounter.  Return in about 5 weeks (around 08/17/2019) for post partum check.  Future Appointments  Date Time Provider Department Center  07/13/2019  4:15 PM Conan Bowens, MD [facility]  07/14/2019  9:45 AM [facility]-SCREENING [facility] None  07/16/2019  8:25 AM MC-LD SCHED ROOM [facility] None    Conan Bowens, MD Center for [facility]

## 2019-07-13 NOTE — Progress Notes (Signed)
Pt here for Post date NST-R and AFI 12.15cm.  Pt is scheduled for IOL 07/16/19.  She does have a virtual visit @ 4:00 today with Dr Earlene Plater.

## 2019-07-13 NOTE — Progress Notes (Signed)
Pt had NST and AFI this AM and due to scheduling had to do Virtual this afternoon.  IOL scheduled 07/16/19

## 2019-07-14 ENCOUNTER — Other Ambulatory Visit (HOSPITAL_COMMUNITY)
Admission: RE | Admit: 2019-07-14 | Discharge: 2019-07-14 | Disposition: A | Discharge status: Home / Self Care | Payer: Medicaid Other | Source: Ambulatory Visit | Attending: Family Medicine | Admitting: Family Medicine

## 2019-07-14 DIAGNOSIS — Z20822 Contact with and (suspected) exposure to covid-19: Secondary | ICD-10-CM | POA: Diagnosis not present

## 2019-07-14 DIAGNOSIS — Z01812 Encounter for preprocedural laboratory examination: Secondary | ICD-10-CM | POA: Insufficient documentation

## 2019-07-15 LAB — SARS CORONAVIRUS 2 (TAT 6-24 HRS): SARS Coronavirus 2: NEGATIVE

## 2019-07-16 ENCOUNTER — Encounter (HOSPITAL_COMMUNITY): Payer: Self-pay | Admitting: Anesthesiology

## 2019-07-16 ENCOUNTER — Inpatient Hospital Stay (HOSPITAL_COMMUNITY): Payer: Medicaid Other

## 2019-07-16 ENCOUNTER — Encounter (HOSPITAL_COMMUNITY): Payer: Self-pay | Admitting: Obstetrics & Gynecology

## 2019-07-16 ENCOUNTER — Inpatient Hospital Stay (HOSPITAL_COMMUNITY)
Admission: AD | Admit: 2019-07-16 | Discharge: 2019-07-20 | DRG: 768 | Disposition: A | Discharge status: Home / Self Care | Payer: Medicaid Other | Attending: Family Medicine | Admitting: Family Medicine

## 2019-07-16 ENCOUNTER — Other Ambulatory Visit: Payer: Self-pay

## 2019-07-16 DIAGNOSIS — O411230 Chorioamnionitis, third trimester, not applicable or unspecified: Secondary | ICD-10-CM | POA: Diagnosis present

## 2019-07-16 DIAGNOSIS — J45909 Unspecified asthma, uncomplicated: Secondary | ICD-10-CM | POA: Diagnosis present

## 2019-07-16 DIAGNOSIS — E669 Obesity, unspecified: Secondary | ICD-10-CM | POA: Diagnosis present

## 2019-07-16 DIAGNOSIS — Z3A41 41 weeks gestation of pregnancy: Secondary | ICD-10-CM

## 2019-07-16 DIAGNOSIS — O480 Post-term pregnancy: Secondary | ICD-10-CM | POA: Diagnosis present

## 2019-07-16 DIAGNOSIS — O1490 Unspecified pre-eclampsia, unspecified trimester: Secondary | ICD-10-CM | POA: Diagnosis not present

## 2019-07-16 DIAGNOSIS — O99214 Obesity complicating childbirth: Secondary | ICD-10-CM | POA: Diagnosis present

## 2019-07-16 DIAGNOSIS — E559 Vitamin D deficiency, unspecified: Secondary | ICD-10-CM

## 2019-07-16 DIAGNOSIS — O9952 Diseases of the respiratory system complicating childbirth: Secondary | ICD-10-CM | POA: Diagnosis present

## 2019-07-16 DIAGNOSIS — Z87891 Personal history of nicotine dependence: Secondary | ICD-10-CM | POA: Diagnosis not present

## 2019-07-16 DIAGNOSIS — O1404 Mild to moderate pre-eclampsia, complicating childbirth: Secondary | ICD-10-CM | POA: Diagnosis present

## 2019-07-16 DIAGNOSIS — O368330 Maternal care for abnormalities of the fetal heart rate or rhythm, third trimester, not applicable or unspecified: Secondary | ICD-10-CM | POA: Diagnosis not present

## 2019-07-16 DIAGNOSIS — O2600 Excessive weight gain in pregnancy, unspecified trimester: Secondary | ICD-10-CM

## 2019-07-16 DIAGNOSIS — O411290 Chorioamnionitis, unspecified trimester, not applicable or unspecified: Secondary | ICD-10-CM | POA: Diagnosis not present

## 2019-07-16 DIAGNOSIS — O99210 Obesity complicating pregnancy, unspecified trimester: Secondary | ICD-10-CM

## 2019-07-16 DIAGNOSIS — Z3400 Encounter for supervision of normal first pregnancy, unspecified trimester: Secondary | ICD-10-CM

## 2019-07-16 LAB — CBC
HCT: 41.9 % (ref 36.0–46.0)
Hemoglobin: 13.7 g/dL (ref 12.0–15.0)
MCH: 29.5 pg (ref 26.0–34.0)
MCHC: 32.7 g/dL (ref 30.0–36.0)
MCV: 90.3 fL (ref 80.0–100.0)
Platelets: 192 10*3/uL (ref 150–400)
RBC: 4.64 MIL/uL (ref 3.87–5.11)
RDW: 13.9 % (ref 11.5–15.5)
WBC: 5.5 10*3/uL (ref 4.0–10.5)
nRBC: 0 % (ref 0.0–0.2)

## 2019-07-16 LAB — COMPREHENSIVE METABOLIC PANEL
ALT: 23 U/L (ref 0–44)
AST: 28 U/L (ref 15–41)
Albumin: 3 g/dL — ABNORMAL LOW (ref 3.5–5.0)
Alkaline Phosphatase: 89 U/L (ref 38–126)
Anion gap: 9 (ref 5–15)
BUN: 6 mg/dL (ref 6–20)
CO2: 19 mmol/L — ABNORMAL LOW (ref 22–32)
Calcium: 9.2 mg/dL (ref 8.9–10.3)
Chloride: 107 mmol/L (ref 98–111)
Creatinine, Ser: 0.57 mg/dL (ref 0.44–1.00)
GFR calc Af Amer: >60 mL/min (ref >60)
GFR calc non Af Amer: >60 mL/min (ref >60)
Glucose, Bld: 112 mg/dL — ABNORMAL HIGH (ref 70–99)
Potassium: 3.7 mmol/L (ref 3.5–5.1)
Sodium: 135 mmol/L (ref 135–145)
Total Bilirubin: 0.3 mg/dL (ref 0.3–1.2)
Total Protein: 6.5 g/dL (ref 6.5–8.1)

## 2019-07-16 LAB — ABO/RH: ABO/RH(D): O POS

## 2019-07-16 LAB — RPR: RPR Ser Ql: NONREACTIVE

## 2019-07-16 MED ORDER — FLEET ENEMA 7-19 GM/118ML RE ENEM: 1.0000 | ENEMA | Freq: Every day | RECTAL | Status: DC | PRN

## 2019-07-16 MED ORDER — MISOPROSTOL 25 MCG QUARTER TABLET: 25.0000 ug | ORAL_TABLET | ORAL | Status: DC | PRN

## 2019-07-16 MED ORDER — OXYTOCIN-SODIUM CHLORIDE 30-0.9 UT/500ML-% IV SOLN
2.5000 [IU]/h | INTRAVENOUS | Status: DC
  Filled 2019-07-16 (×2): qty 500

## 2019-07-16 MED ORDER — SOD CITRATE-CITRIC ACID 500-334 MG/5ML PO SOLN: 30.0000 mL | ORAL | Status: DC | PRN

## 2019-07-16 MED ORDER — HYDROXYZINE HCL 50 MG PO TABS
50.0000 mg | ORAL_TABLET | Freq: Four times a day (QID) | ORAL | Status: DC | PRN
  Filled 2019-07-16: qty 1

## 2019-07-16 MED ORDER — LIDOCAINE HCL (PF) 1 % IJ SOLN
30.0000 mL | INTRAMUSCULAR | Status: DC | PRN
  Filled 2019-07-16: qty 30

## 2019-07-16 MED ORDER — MISOPROSTOL 50MCG HALF TABLET
ORAL_TABLET | ORAL | Status: AC
  Administered 2019-07-16: 50 ug via BUCCAL
  Filled 2019-07-16: qty 1

## 2019-07-16 MED ORDER — LACTATED RINGERS IV SOLN: INTRAVENOUS | Status: DC

## 2019-07-16 MED ORDER — TERBUTALINE SULFATE 1 MG/ML IJ SOLN
0.2500 mg | Freq: Once | INTRAMUSCULAR | Status: DC | PRN
  Filled 2019-07-16: qty 1

## 2019-07-16 MED ORDER — OXYTOCIN-SODIUM CHLORIDE 30-0.9 UT/500ML-% IV SOLN
1.0000 m[IU]/min | INTRAVENOUS | Status: DC
  Administered 2019-07-17: 2 m[IU]/min via INTRAVENOUS
  Filled 2019-07-16: qty 500

## 2019-07-16 MED ORDER — FENTANYL CITRATE (PF) 100 MCG/2ML IJ SOLN
50.0000 ug | INTRAMUSCULAR | Status: DC | PRN
  Administered 2019-07-17: 50 ug via INTRAVENOUS
  Administered 2019-07-17: 100 ug via INTRAVENOUS
  Filled 2019-07-16 (×2): qty 2

## 2019-07-16 MED ORDER — ACETAMINOPHEN 325 MG PO TABS
650.0000 mg | ORAL_TABLET | ORAL | Status: DC | PRN
  Filled 2019-07-16: qty 2

## 2019-07-16 MED ORDER — ZOLPIDEM TARTRATE 5 MG PO TABS: 5.0000 mg | ORAL_TABLET | Freq: Every evening | ORAL | Status: DC | PRN

## 2019-07-16 MED ORDER — MISOPROSTOL 50MCG HALF TABLET
50.0000 ug | ORAL_TABLET | ORAL | Status: DC | PRN
  Administered 2019-07-16 – 2019-07-17 (×4): 50 ug via BUCCAL
  Filled 2019-07-16 (×5): qty 1

## 2019-07-16 MED ORDER — OXYCODONE-ACETAMINOPHEN 5-325 MG PO TABS: 1.0000 | ORAL_TABLET | ORAL | Status: DC | PRN

## 2019-07-16 MED ORDER — OXYCODONE-ACETAMINOPHEN 5-325 MG PO TABS: 2.0000 | ORAL_TABLET | ORAL | Status: DC | PRN

## 2019-07-16 MED ORDER — OXYTOCIN BOLUS FROM INFUSION
500.0000 mL | Freq: Once | INTRAVENOUS | Status: AC
  Administered 2019-07-18: 500 mL via INTRAVENOUS

## 2019-07-16 MED ORDER — LACTATED RINGERS IV SOLN
500.0000 mL | INTRAVENOUS | Status: DC | PRN
  Administered 2019-07-17 (×2): 1000 mL via INTRAVENOUS

## 2019-07-16 MED ORDER — ONDANSETRON HCL 4 MG/2ML IJ SOLN
4.0000 mg | Freq: Four times a day (QID) | INTRAMUSCULAR | Status: DC | PRN
  Administered 2019-07-16: 4 mg via INTRAVENOUS
  Filled 2019-07-16: qty 2

## 2019-07-16 NOTE — Progress Notes (Signed)
Labor Progress Note Hannah Crosby is a 34 y.o. G1P0 at [redacted]w[redacted]d presented for IOL postdates  S:  Patient comfortable  O:  BP 130/72   Pulse 96   Temp 98.6 F (37 C) (Oral)   Resp 18   Ht 5\' 2"  (1.575 m)   Wt 108.9 kg   LMP 10/02/2018   BMI 43.90 kg/m   Fetal Tracing:  Baseline: 135 Variability: moderate Accels: 15x15 Decels: none  Toco: none   CVE: Dilation: Closed Effacement (%): 50 Station: -3 Presentation: Vertex Exam by:: [email or web address], CNM    A&P: 34 y.o. G1P0 [redacted]w[redacted]d IOL PD #Labor: Cervix softer but still closed. Will continue cytotec #Pain: per patient request #FWB: Cat 1 #GBS negative  [redacted]w[redacted]d, CNM 1:50 PM

## 2019-07-16 NOTE — Progress Notes (Signed)
Labor Progress Note Hannah Crosby is a 34 y.o. G1P0 at 87w0dpresented for IOL postdates  S:  Met patient and discussed plan. Cramping. Agreeable to FB placement.   O:  BP (!) 147/77   Pulse 100   Temp 98.3 F (36.8 C) (Oral)   Resp 18   Ht '5\' 2"'  (1.575 m)   Wt 108.9 kg   LMP 10/02/2018   BMI 43.90 kg/m   Fetal Tracing:  Baseline: 135 Variability: moderate Accels: 15x15 Decels: none  Toco: infrequent    CVE: Dilation: Closed Effacement (%): 50 Cervical Position: Posterior Station: -3 Presentation: Vertex Exam by:: Dr. FMarice Potter  A&P: 34y.o. G1P0 471w0dOL PD. #Labor: Vertex by exam. Will give 4th Cytotec. Foley bulb placed with speculum and filled with 60 mL water; patient tolerated well. AROM/Pit PRN. Anticipate SVD. #Pain: per patient request #FWB: Cat I #GBS negative  ChChauncey MannMD 10:21 PM

## 2019-07-16 NOTE — H&P (Signed)
OBSTETRIC ADMISSION HISTORY AND PHYSICAL  Hannah Crosby is a 34 y.o. female G1P0 with IUP at [redacted]w[redacted]d by LMP presenting for IOL for postdates. She reports +FMs, No LOF, no VB, no blurry vision, headaches or peripheral edema, and RUQ pain.  She plans on breast feeding. She request nothing for birth control. She received her prenatal care at [facility]   Dating: By LMP --->  Estimated Date of Delivery: 07/09/19   Nursing Staff Provider  Office Location  [facility] Dating   LMP & 10 wk U/S (pt has images)  Language   English Anatomy US   incomplete>nml  Flu Vaccine   declined Genetic Screen  NIPS: negative   TDaP vaccine  04/18/19 Hgb A1C or  GTT Early> Third trimester 83/121/118  Rhogam  O positive    LAB RESULTS   Feeding Plan  Breast Blood Type   O pos  Contraception  information given 04/14/2019 Antibody   neg  Circumcision  Yes (inpt) Rubella   immune  Pediatrician   list provided RPR   NR  Support Person  Hannah Crosby HBsAg   NR  Prenatal Classes  HIV  NR  BTL Consent NA GBS  (For PCN allergy, check sensitivities)   VBAC Consent NA Pap  normal 02/17/19    Hgb Electro   neg    CF Neg    SMA Neg    Waterbirth  [ ]  Class [ ]  Consent [ ]  CNM visit    Prenatal History/Complications: obesity, vitamin D deficiency  Past Medical History: Past Medical History:  Diagnosis Date  . Asthma    albuterol- 2 days ago    Past Surgical History: History reviewed. No pertinent surgical history.  Obstetrical History: OB History    Gravida  1   Para      Term      Preterm      AB      Living  0     SAB      TAB      Ectopic      Multiple      Live Births              Social History Social History   Socioeconomic History  . Marital status: Single    Spouse name: Not on file  . Number of children: Not on file  . Years of education: Not on file  . Highest education level: Not on file  Occupational History  . Occupation:  Tobacco Use  . Smoking status: Former Smoker     Packs/day: 0.25    Types: Cigarettes  . Smokeless tobacco: Never Used  Substance and Sexual Activity  . Alcohol use: Never  . Drug use: Never  . Sexual activity: Yes    Birth control/protection: None  Other Topics Concern  . Not on file  Social History Narrative  . Not on file   Social Determinants of Health   Financial Resource Strain:   . Difficulty of Paying Living Expenses:   Food Insecurity:   . Worried About in the Last Year:   . in the Last Year:   Transportation Needs:   . Runner, broadcasting/film/video (Medical):   Programme researcher, broadcasting/film/video Lack of Transportation (Non-Medical):   Physical Activity:   . Days of Exercise per Week:   . Minutes of Exercise per Session:   Stress:   . Feeling of Stress :   Social Connections:   . Frequency of  Communication with Friends and Family:   . Frequency of Social Gatherings with Friends and Family:   . Attends Religious Services:   . Active Member of Clubs or Organizations:   . Attends Archivist Meetings:   Marland Kitchen Marital Status:     Family History: Family History  Problem Relation Age of Onset  . Diabetes Mother   . Diabetes Father   . Heart disease Father     Allergies: Allergies  Allergen Reactions  . Banana Anaphylaxis    Medications Prior to Admission  Medication Sig Dispense Refill Last Dose  . ALBUTEROL IN Inhale into the lungs.   07/14/2019  . aspirin (ASPIRIN CHILDRENS) 81 MG chewable tablet Chew 1 tablet (81 mg total) by mouth daily. 90 tablet 3   . Blood Pressure Monitoring (BLOOD PRESSURE CUFF) MISC 1 Device by Does not apply route as needed. 1 each 0 07/14/2019  . Docosahexaenoic Acid (DHA PO) Take by mouth.     Water engineer Bandages & Supports (WRIST BRACE/LEFT SMALL) MISC 1 Device by Does not apply route daily. 1 each 0   . Elastic Bandages & Supports (WRIST BRACE/RIGHT SMALL) MISC 1 Device by Does not apply route daily. 1 each 0   . Prenat-Fe Carbonyl-FA-Omega 3 (ONE-A-DAY WOMENS PRENATAL 1)  28-0.8-235 MG CAPS Take 1 tablet by mouth daily. 30 capsule 12   . Vitamin D, Cholecalciferol, 50 MCG (2000 UT) CAPS Take 4,000 Units by mouth daily.        Review of Systems   All systems reviewed and negative except as stated in HPI  Last menstrual period 10/02/2018. General appearance: alert, cooperative and no distress Lungs: clear to auscultation bilaterally Heart: regular rate and rhythm Abdomen: soft, non-tender; bowel sounds normal Pelvic: n/a Extremities: Homans sign is negative, no sign of DVT DTR's +2 Presentation: cephalic Fetal monitoringBaseline: 120 bpm, Variability: Good {> 6 bpm), Accelerations: Reactive and Decelerations: Absent Uterine activity: occasional uc's     Prenatal labs: ABO, Rh: O/RH(D) POSITIVE/-- (01/08 1020) Antibody: NO ANTIBODIES DETECTED (01/08 1020) Rubella: 15.70 (01/08 1020) RPR: NON-REACTIVE (03/05 0858)  HBsAg: NON-REACTIVE (01/08 1020)  HIV: NON-REACTIVE (03/05 0858)  GBS:   Negative  Prenatal Transfer Tool  Maternal Diabetes: No Genetic Screening: Normal Maternal Ultrasounds/Referrals: Normal Fetal Ultrasounds or other Referrals:  None Maternal Substance Abuse:  No Significant Maternal Medications:  None Significant Maternal Lab Results: Group B Strep negative  No results found for this or any previous visit (from the past 24 hour(s)).  Patient Active Problem List   Diagnosis Date Noted  . Post term pregnancy over 40 weeks 07/16/2019  . Vitamin D deficiency 02/20/2019  . Supervision of normal first pregnancy 02/17/2019  . Obesity affecting pregnancy 02/17/2019  . Excess weight gain in pregnancy 02/17/2019    Assessment/Plan:  Hannah Crosby is a 33 y.o. G1P0 at [redacted]w[redacted]d here for IOL postdates  #Labor: cytotec, plan FB when able  Patient refusing blood products. Lengthy discussion about use of blood products in labor and delivery. Reassured patient that we could reserve blood products for last resort and only in life  saving emergency. Patient states she does not want blood products, not even in needed for life saving situation.  #Pain: Per patient request #FWB: Cat 1 #ID:  GBS neg #MOF: Breast #MOC: none #Circ:  Yes, inpatient  Wende Mott, CNM  07/16/2019, 8:43 AM

## 2019-07-17 ENCOUNTER — Inpatient Hospital Stay (HOSPITAL_COMMUNITY): Payer: Medicaid Other | Admitting: Anesthesiology

## 2019-07-17 DIAGNOSIS — O1490 Unspecified pre-eclampsia, unspecified trimester: Secondary | ICD-10-CM | POA: Diagnosis not present

## 2019-07-17 LAB — CBC
HCT: 40.7 % (ref 36.0–46.0)
Hemoglobin: 13.5 g/dL (ref 12.0–15.0)
MCH: 29.9 pg (ref 26.0–34.0)
MCHC: 33.2 g/dL (ref 30.0–36.0)
MCV: 90.2 fL (ref 80.0–100.0)
Platelets: 186 10*3/uL (ref 150–400)
RBC: 4.51 MIL/uL (ref 3.87–5.11)
RDW: 13.8 % (ref 11.5–15.5)
WBC: 8.9 10*3/uL (ref 4.0–10.5)
nRBC: 0 % (ref 0.0–0.2)

## 2019-07-17 LAB — TYPE AND SCREEN
ABO/RH(D): O POS
Antibody Screen: NEGATIVE

## 2019-07-17 LAB — PROTEIN / CREATININE RATIO, URINE
Creatinine, Urine: 39.73 mg/dL
Protein Creatinine Ratio: 0.53 mg/mg{Cre} — ABNORMAL HIGH (ref 0.00–0.15)
Total Protein, Urine: 21 mg/dL

## 2019-07-17 MED ORDER — LACTATED RINGERS AMNIOINFUSION
INTRAVENOUS | Status: DC
  Filled 2019-07-17 (×10): qty 1000

## 2019-07-17 MED ORDER — LACTATED RINGERS IV SOLN: 500.0000 mL | Freq: Once | INTRAVENOUS | Status: AC

## 2019-07-17 MED ORDER — LIDOCAINE HCL (PF) 1 % IJ SOLN
INTRAMUSCULAR | Status: DC | PRN
  Administered 2019-07-17: 3 mL via EPIDURAL
  Administered 2019-07-17: 2 mL via EPIDURAL
  Administered 2019-07-17: 5 mL via EPIDURAL

## 2019-07-17 MED ORDER — SODIUM CHLORIDE (PF) 0.9 % IJ SOLN
INTRAMUSCULAR | Status: DC | PRN
  Administered 2019-07-17: 12 mL/h via EPIDURAL

## 2019-07-17 MED ORDER — SODIUM CHLORIDE 0.9 % IV SOLN
2.0000 g | Freq: Four times a day (QID) | INTRAVENOUS | Status: DC
  Administered 2019-07-17 – 2019-07-18 (×3): 2 g via INTRAVENOUS
  Filled 2019-07-17 (×6): qty 2000

## 2019-07-17 MED ORDER — LACTATED RINGERS IV SOLN
500.0000 mL | Freq: Once | INTRAVENOUS | Status: AC
  Administered 2019-07-17: 1000 mL via INTRAVENOUS

## 2019-07-17 MED ORDER — GENTAMICIN SULFATE 40 MG/ML IJ SOLN
5.0000 mg/kg | INTRAVENOUS | Status: DC
  Administered 2019-07-17: 370 mg via INTRAVENOUS
  Filled 2019-07-17 (×2): qty 9.25

## 2019-07-17 MED ORDER — TERBUTALINE SULFATE 1 MG/ML IJ SOLN
0.2500 mg | Freq: Once | INTRAMUSCULAR | Status: DC | PRN
  Filled 2019-07-17: qty 1

## 2019-07-17 MED ORDER — EPHEDRINE 5 MG/ML INJ
10.0000 mg | INTRAVENOUS | Status: DC | PRN
  Filled 2019-07-17: qty 2

## 2019-07-17 MED ORDER — FENTANYL-BUPIVACAINE-NACL 0.5-0.125-0.9 MG/250ML-% EP SOLN
12.0000 mL/h | EPIDURAL | Status: DC | PRN
  Administered 2019-07-18: 12 mL/h via EPIDURAL
  Filled 2019-07-17 (×2): qty 250

## 2019-07-17 MED ORDER — ACETAMINOPHEN 500 MG PO TABS
1000.0000 mg | ORAL_TABLET | Freq: Four times a day (QID) | ORAL | Status: DC | PRN
  Administered 2019-07-17: 1000 mg via ORAL
  Filled 2019-07-17: qty 2

## 2019-07-17 MED ORDER — DIPHENHYDRAMINE HCL 50 MG/ML IJ SOLN: 12.5000 mg | INTRAMUSCULAR | Status: DC | PRN

## 2019-07-17 MED ORDER — PHENYLEPHRINE 40 MCG/ML (10ML) SYRINGE FOR IV PUSH (FOR BLOOD PRESSURE SUPPORT)
80.0000 ug | PREFILLED_SYRINGE | INTRAVENOUS | Status: DC | PRN
  Filled 2019-07-17: qty 10

## 2019-07-17 NOTE — Progress Notes (Addendum)
Hannah Crosby is a 34 y.o. G1P0 at [redacted]w[redacted]d  admitted for IOL for postdates and found to be pre-e without severe features.  Subjective:  She is experiencing discomfort due to lots of pressure. Denies signs and symptoms of pre-e.   Objective: Vitals:   07/17/19 1501 07/17/19 1531 07/17/19 1601 07/17/19 1631  BP: (!) 144/78 139/83 134/88 135/65  Pulse: 98 (!) 101 (!) 115 (!) 106  Resp: 16 20 18 18   Temp: 99.2 F (37.3 C)     TempSrc: Axillary     SpO2:      Weight:      Height:       Total I/O In: -  Out: 100 [Urine:100]  FHT:  FHR: 155 bpm, variability: marked,  accelerations:  Present,  decelerations:  Absent UC:   q2-3 SVE:   Dilation: 6(Simultaneous filing. User may not have seen previous data.) Effacement (%): 60 Station: -2 Exam by:: [email or web address], CNM Pitocin @ 20 mu/min  Labs: Lab Results  Component Value Date   WBC 8.9 07/17/2019   HGB 13.5 07/17/2019   HCT 40.7 07/17/2019   MCV 90.2 07/17/2019   PLT 186 07/17/2019    Patient Vitals for the past 24 hrs:  BP Temp Temp src Pulse Resp SpO2  07/17/19 1631 135/65 - - (!) 106 18 -  07/17/19 1601 134/88 - - (!) 115 18 -  07/17/19 1531 139/83 - - (!) 101 20 -  07/17/19 1501 (!) 144/78 99.2 F (37.3 C) Axillary 98 16 -  07/17/19 1431 (!) 143/77 - - (!) 107 18 -  07/17/19 1402 - 99.5 F (37.5 C) Oral - - -  07/17/19 1401 135/78 99.5 F (37.5 C) Axillary (!) 111 18 -  07/17/19 1331 119/65 - - (!) 107 16 -  07/17/19 1301 (!) 93/30 - - (!) 103 18 -  07/17/19 1231 127/67 - - 97 16 -  07/17/19 1201 121/75 - - 97 16 -  07/17/19 1131 127/60 - - (!) 101 18 -  07/17/19 1101 126/76 - - 93 20 -  07/17/19 1031 126/64 98.4 F (36.9 C) Oral 90 18 -  07/17/19 1001 127/69 - - (!) 116 20 -  07/17/19 0936 126/76 - - 93 18 -  07/17/19 0935 - - - - - 97 %  07/17/19 0931 117/66 - - 93 18 97 %  07/17/19 0926 121/62 - - 93 20 -  07/17/19 0925 - - - - - 98 %  07/17/19 0921 (!) 138/101 - - (!) 101 20 -  07/17/19 0920 -  - - - - 99 %  07/17/19 0916 (!) 128/99 - - 93 20 100 %  07/17/19 0911 (!) 153/83 - - 90 18 98 %  07/17/19 0906 (!) 157/91 - - 92 20 99 %  07/17/19 0901 (!) 161/86 - - (!) 101 20 99 %  07/17/19 0856 (!) 158/90 - - 98 20 -  07/17/19 0853 (!) 155/89 - - 95 20 -  07/17/19 0850 (!) 162/104 - - (!) 103 20 -  07/17/19 0841 (!) 155/86 - - 96 20 -  07/17/19 0830 - 98.1 F (36.7 C) Oral - - -  07/17/19 0801 (!) 162/86 - - 77 20 -  07/17/19 0732 (!) 173/79 - - 83 20 -  07/17/19 0700 (!) 159/86 - - 95 18 -  07/17/19 0629 (!) 143/94 - - 99 - -  07/17/19 0627 - 98.9 F (37.2 C) Oral - - -  07/17/19  0530 140/90 - - 90 18 -  07/17/19 0200 (!) 142/74 - - 90 16 -  07/17/19 0013 (!) 141/96 98 F (36.7 C) Oral 97 20 -  07/16/19 2257 (!) 156/87 - - (!) 101 - -  07/16/19 2007 (!) 147/77 - - 100 18 -  07/16/19 1927 - 98.3 F (36.8 C) Oral - 18 -  07/16/19 1745 (!) 143/79 - - 98 18 -     Assessment / Plan: Patient is uncomfortable 2/2/ pressure; Cervix dilated to 6 cm. Continue to titrate pit. Temp is 99; continue to monitor as baseline has risen. AROM and IUPC insertion reccomended, will revisit in one hour.  -no signs of pre-e at this time; BPs are rising but not severe.   Labor: Progressing on Pitocin, will continue to increase then AROM Fetal Wellbeing:  Category I Pain Control:  Epidural Anticipated MOD:  NSVD  Hannah Crosby 07/17/2019, 4:39 PM   CNM attestation:  I have seen and examined this patient and agree with above documentation in the med student's note.   Hannah Crosby is a 34 y.o. G1P0 at [redacted]w[redacted]d here for IOL for postdates and then found to have pre-e without severe features.   PE: Patient Vitals for the past 24 hrs:  BP Temp Temp src Pulse Resp SpO2  07/17/19 1631 135/65 - - (!) 106 18 -  07/17/19 1601 134/88 - - (!) 115 18 -  07/17/19 1531 139/83 - - (!) 101 20 -  07/17/19 1501 (!) 144/78 99.2 F (37.3 C) Axillary 98 16 -  07/17/19 1431 (!) 143/77 - - (!)  107 18 -  07/17/19 1402 - 99.5 F (37.5 C) Oral - - -  07/17/19 1401 135/78 99.5 F (37.5 C) Axillary (!) 111 18 -  07/17/19 1331 119/65 - - (!) 107 16 -  07/17/19 1301 (!) 93/30 - - (!) 103 18 -  07/17/19 1231 127/67 - - 97 16 -  07/17/19 1201 121/75 - - 97 16 -  07/17/19 1131 127/60 - - (!) 101 18 -  07/17/19 1101 126/76 - - 93 20 -  07/17/19 1031 126/64 98.4 F (36.9 C) Oral 90 18 -  07/17/19 1001 127/69 - - (!) 116 20 -  07/17/19 0936 126/76 - - 93 18 -  07/17/19 0935 - - - - - 97 %  07/17/19 0931 117/66 - - 93 18 97 %  07/17/19 0926 121/62 - - 93 20 -  07/17/19 0925 - - - - - 98 %  07/17/19 0921 (!) 138/101 - - (!) 101 20 -  07/17/19 0920 - - - - - 99 %  07/17/19 0916 (!) 128/99 - - 93 20 100 %  07/17/19 0911 (!) 153/83 - - 90 18 98 %  07/17/19 0906 (!) 157/91 - - 92 20 99 %  07/17/19 0901 (!) 161/86 - - (!) 101 20 99 %  07/17/19 0856 (!) 158/90 - - 98 20 -  07/17/19 0853 (!) 155/89 - - 95 20 -  07/17/19 0850 (!) 162/104 - - (!) 103 20 -  07/17/19 0841 (!) 155/86 - - 96 20 -  07/17/19 0830 - 98.1 F (36.7 C) Oral - - -  07/17/19 0801 (!) 162/86 - - 77 20 -  07/17/19 0732 (!) 173/79 - - 83 20 -  07/17/19 0700 (!) 159/86 - - 95 18 -  07/17/19 0629 (!) 143/94 - - 99 - -  07/17/19 0627 - 98.9 F (37.2 C)  Oral - - -  07/17/19 0530 140/90 - - 90 18 -  07/17/19 0200 (!) 142/74 - - 90 16 -  07/17/19 0013 (!) 141/96 98 F (36.7 C) Oral 97 20 -  07/16/19 2257 (!) 156/87 - - (!) 101 - -  07/16/19 2007 (!) 147/77 - - 100 18 -  07/16/19 1927 - 98.3 F (36.8 C) Oral - 18 -  07/16/19 1745 (!) 143/79 - - 98 18 -   Gen: calm comfortable, NAD Resp: normal effort, no distress Heart: Regular rate Abd: Soft, NT, gravid, S=D  FHR: Baseline 150 bpm, mod var, present acel, neg decels, contractions are irregular;  Patient had no true acels this afternoon from approximately 1440 to 1540; patient had juice and tracing improved.  ROS, labs, PMH reviewed    Assessment and  Plan: Patient has made some change from 4.6 to 6; she has a bulging BOW but does not want to be ruptured right now. Continue to watch patient closely; will reassess AROM at 1700.    Marylene Land, CNM 07/17/2019 4:40 PM

## 2019-07-17 NOTE — Progress Notes (Signed)
Pharmacy Antibiotic Note  Landrie Beale is a 34 y.o. female admitted on 07/16/2019 for IOL due to postdate.  Pharmacy has been consulted for Gentamicin dosing for Chorioamnionitis/ Triple I due to maternal temp during labor.   Plan: Gentamicin 370mg  (5mg /kg) IV q24h Will continue to follow  Height: 5\' 2"  (157.5 cm) Weight: 108.9 kg (240 lb) IBW/kg (Calculated) : 50.1  ABW/Dosing: 73.6kg  Temp (24hrs), Avg:99.1 F (37.3 C), Min:98 F (36.7 C), Max:100.6 F (38.1 C)  Recent Labs  Lab 07/16/19 0842 07/16/19 1103 07/17/19 0721  WBC 5.5  --  8.9  CREATININE  --  0.57  --     Estimated Creatinine Clearance: 116.2 mL/min (by C-G formula based on SCr of 0.57 mg/dL).    Allergies  Allergen Reactions  . Banana Anaphylaxis    Antimicrobials this admission: Ampicillin 2 gram IV q6h 6/7 >>    Thank you for allowing pharmacy to be a part of this patient's care.  09/15/19 07/17/2019 6:36 PM

## 2019-07-17 NOTE — Progress Notes (Signed)
   Hannah Crosby is a 34 y.o. G1P0 at [redacted]w[redacted]d  admitted for IOL for post dates. She denies history of blood pressure and blood sugar issues.   Subjective: Coping well; has epidural in place so feeling comfortable.   Objective: Vitals:   07/17/19 0926 07/17/19 0931 07/17/19 0935 07/17/19 0936  BP: 121/62 117/66  126/76  Pulse: 93 93  93  Resp: 20 18  18   Temp:      TempSrc:      SpO2:  97% 97%   Weight:      Height:       No intake/output data recorded.  FHT:  FHR: 145 bpm, variability: moderate,  accelerations:  Present,  decelerations:  Absent UC:   irregular, every 2-4 minutes SVE:   Dilation: 4 Effacement (%): 60 Station: -3 Exam by:: [email or web address], MD Pitocin @ 4 mu/min  Labs: Lab Results  Component Value Date   WBC 8.9 07/17/2019   HGB 13.5 07/17/2019   HCT 40.7 07/17/2019   MCV 90.2 07/17/2019   PLT 186 07/17/2019    Assessment / Plan: IOL; now Day 2 of IOl. Has epidural; normal labs CBC and CMP but has PCR of 0.52, no signs or symptoms of pre-e to indicate now severe features.  -Severe range blood pressures while repositioning due to epidural placement that have now resolved.  Labor: early labor, pitocin at 4 Fetal Wellbeing:  Category I Pain Control:  Epidural Anticipated MOD:  NSVD  09/16/2019 07/17/2019, 9:51 AM

## 2019-07-17 NOTE — Progress Notes (Signed)
Hannah Crosby is a 34 y.o. G1P0 at [redacted]w[redacted]d admitted for induction of labor due to Post dates, now with Pre-E w/o severe features. Due date 07/17/19.  Subjective: Uncomfortable, would like re-dosing of her epidural.  Objective: BP 121/66   Pulse (!) 101   Temp 99.8 F (37.7 C) (Axillary)   Resp 20   Ht 5\' 2"  (1.575 m)   Wt 108.9 kg   LMP 10/02/2018   SpO2 97%   BMI 43.90 kg/m  Total I/O In: -  Out: 275 [Urine:275]  FHT:  FHR: 150 bpm, variability: minimal to moderate,  accelerations:  Abscent,  decelerations:  Present - one 2-minute prolonged decel while the patient was on her back UC:   irregular, every 3-4 minutes  SVE:   Dilation: 6.5 Effacement (%): 90 Station: -2 Exam by:: [email or web address], CNM  Pitocin @ 0 mu/min  Labs: Lab Results  Component Value Date   WBC 8.9 07/17/2019   HGB 13.5 07/17/2019   HCT 40.7 07/17/2019   MCV 90.2 07/17/2019   PLT 186 07/17/2019    Assessment / Plan: 34 yo G1P0 at 41.1 EGA here for IOL 2/2 post dates, now with Pre-E w/o severe features  Labor:  -s/p cyto x5, FB. Was started on Pitocin at 0623 this AM, pit break at 1756 since MVUs were 80 (not adequate) -Has 6-6.5 cm dilated since 4 PM -Now with Triple I, on amp/gent -IUPC with amnioinfusion -One prolonged decel while patient was on her back, plan to restart Pitocin at 2130 -Case discussed with Dr. 2131, plan to recheck at 2330 -Long discussion had with patient and support person about method of delivery. Discussed Triple I, antibiotics, and lack of cervical change. Patient has voiced appropriate frustration with her lack of progress, but would still like to proceed with Pitocin at this time. We discussed what the next steps would be if she continues to have fever and no cervical change. She understands that delivery may involve a Cesarean section, and is ok with that should she be unchanged at her next exam. Fetal Wellbeing:  Category II, reassuring for moderate variability  and return to baseline after position change from decel Pain Control:  Epidural Pre-eclampsia: no severe range pressures I/D:  GBS negative Anticipated MOD:  Guarded for vaginal delivery  2331 DO OB Fellow, [facility] 07/17/2019, 9:18 PM

## 2019-07-17 NOTE — Progress Notes (Signed)
Labor Progress Note Jaqulyn Chancellor is a 34 y.o. G1P0 at [redacted]w[redacted]d presented for IOL postdates  S:  More uncomfortable with ctx and requesting pain medications.   O:  BP 140/90   Pulse 90   Temp 98 F (36.7 C) (Oral)   Resp 18   Ht 5\' 2"  (1.575 m)   Wt 108.9 kg   LMP 10/02/2018   BMI 43.90 kg/m   Fetal Tracing:  Baseline: 145 Variability: moderate Accels: 15x15 Decels: none  Toco: q2-56m   CVE: Dilation: 4 Effacement (%): 60 Cervical Position: Posterior Station: -3 Presentation: Vertex Exam by:: [email or web address], MD   A&P: 34 y.o. G1P0 at [redacted]w[redacted]d here for IOL PD. #Labor: S/p Cytotec x5. FB now out. Cervical effacement appreciated from prior exam. Will start Pitocin. AROM PRN. Anticipate SVD. #Pain: per patient request #FWB: Cat I #GBS negative  #Pre-E: Pr/Cr 0.53, CMP WNL. Asymptomatic. Cont to monitor BP's.   [redacted]w[redacted]d, MD 6:24 AM

## 2019-07-17 NOTE — Progress Notes (Signed)
Patient Hannah Crosby is a 34 y.o. G1P0  at [redacted]w[redacted]d here for IOL for post dates now with pre-e. She states that she does not want any blood products administered during her stay at [facility]. She is not a Jehovah's Witness.  She states that "no emergency is going to happen". She states that blood products are "someone else's life" and that she has read some science about this and does not want blood. She did not answer the question about whether or not she would accept blood products to save her life, but nodded when I said we would ask her again if there was an emergency. Her mother, who will be making decisions for her should she become unable to make decisions for herself, also stated that "she will be fine" and that "there won't be any need for blood".  Patient seemed open to reconsidering if her status changed.   Luna Kitchens

## 2019-07-17 NOTE — Anesthesia Procedure Notes (Signed)
Epidural Patient location during procedure: OB Start time: 07/17/2019 8:43 AM End time: 07/17/2019 8:50 AM  Staffing Anesthesiologist: Cecile Hearing, MD Performed: anesthesiologist   Preanesthetic Checklist Completed: patient identified, IV checked, risks and benefits discussed, monitors and equipment checked, pre-op evaluation and timeout performed  Epidural Patient position: sitting Prep: DuraPrep Patient monitoring: blood pressure and continuous pulse ox Approach: midline Location: L3-L4 Injection technique: LOR air  Needle:  Needle type: Tuohy  Needle gauge: 17 G Needle length: 9 cm Needle insertion depth: 6.5 cm Catheter size: 19 Gauge Catheter at skin depth: 12 cm Test dose: negative and Other (1% Lidocaine)  Additional Notes Patient identified.  Risk benefits discussed including failed block, incomplete pain control, headache, nerve damage, paralysis, blood pressure changes, nausea, vomiting, reactions to medication both toxic or allergic, and postpartum back pain.  Patient expressed understanding and wished to proceed.  All questions were answered.  Sterile technique used throughout procedure and epidural site dressed with sterile barrier dressing. No paresthesia or other complications noted. The patient did not experience any signs of intravascular injection such as tinnitus or metallic taste in mouth nor signs of intrathecal spread such as rapid motor block. Please see nursing notes for vital signs. Reason for block:procedure for pain

## 2019-07-17 NOTE — Progress Notes (Signed)
   Hannah Crosby is a 34 y.o. G1P0 at [redacted]w[redacted]d  admitted for IOL for postdates. While she was on labor and delivery, she was found to have increased blood pressures and subsequently ruled-in for pre-e without severe features due to PCR of 0.53.   Subjective: Patient feeling LOF due to amnioinfusion; denies signs symptoms of pre-e.   Objective: Vitals:   07/17/19 1801 07/17/19 1818 07/17/19 1831 07/17/19 1901  BP: (!) 141/90  117/66 131/76  Pulse: (!) 102  (!) 105 (!) 105  Resp: 18  18 20   Temp:  (!) 100.4 F (38 C)    TempSrc:  Axillary    SpO2:      Weight:      Height:       Total I/O In: -  Out: 275 [Urine:275]  FHT: FHR is 165 bpm, minimal to moderate variability;  UC:   irregular, every 1-5 minutes SVE:   Dilation: 6.5 Effacement (%): 70 Station: -2 Exam by:: [email or web address], CNM Pitocin @ 0 mu/min  Labs: Lab Results  Component Value Date   WBC 8.9 07/17/2019   HGB 13.5 07/17/2019   HCT 40.7 07/17/2019   MCV 90.2 07/17/2019   PLT 186 07/17/2019    Assessment / Plan: patient's contraction pattern has slowed down; MVUs are now 80. However, due to large deceleration and mini-moderate variability, will not restart pitocin at this time.  -Tylenol PO ordered -Decerelation was not repetitive, patient was positioned to left side.  -Light mec noted on towel Labor: primip at 6-7 cm; on pitocin break with inadqueante MVUs Fetal Wellbeing:  Category II; will continue position changes, amnioinfusion, and do not restart pitocin Pain Control:  Epidural Anticipated MOD:  NSVD  09/16/2019 Lash Matulich 07/17/2019, 7:25 PM

## 2019-07-17 NOTE — Progress Notes (Signed)
Labor Progress Note Dezeray Puccio is a 34 y.o. G1P0 at [redacted]w[redacted]d presented for IOL postdates  S:  Uncomfortable with foley balloon but declines pain medication.   O:  BP (!) 142/74   Pulse 90   Temp 98 F (36.7 C) (Oral)   Resp 16   Ht 5\' 2"  (1.575 m)   Wt 108.9 kg   LMP 10/02/2018   BMI 43.90 kg/m   Fetal Tracing:  Baseline: 145 Variability: moderate Accels: 15x15 Decels: none  Toco: irregular    CVE: Dilation: Closed Effacement (%): 50 Cervical Position: Posterior Station: -3 Presentation: Vertex Exam by:: Dr. [email or web address]   A&P: 34 y.o. G1P0 at [redacted]w[redacted]d here for IOL PD. #Labor: S/p Cytotec x5. FB still in place and will likely need 6th Cytotec. Plan for Pit when FB out. Anticipate SVD. #Pain: per patient request #FWB: Cat I #GBS negative  #Elevated BP's: Now has had elevated BP's > 4 hours apart, CMP WNL, will obtain Pr/Cr. Asymptomatic. Pre-E vs gHTN.   [redacted]w[redacted]d, MD 3:03 AM

## 2019-07-17 NOTE — Anesthesia Preprocedure Evaluation (Signed)
Anesthesia Evaluation  Patient identified by MRN, date of birth, ID band Patient awake    Reviewed: Allergy & Precautions, NPO status , Patient's Chart, lab work & pertinent test results  Airway Mallampati: II  TM Distance: >3 FB Neck ROM: Full    Dental  (+) Teeth Intact, Dental Advisory Given   Pulmonary asthma , former smoker,    Pulmonary exam normal breath sounds clear to auscultation       Cardiovascular hypertension (Pre-E), Normal cardiovascular exam Rhythm:Regular Rate:Normal     Neuro/Psych negative neurological ROS  negative psych ROS   GI/Hepatic negative GI ROS, Neg liver ROS,   Endo/Other  Morbid obesity  Renal/GU negative Renal ROS     Musculoskeletal negative musculoskeletal ROS (+)   Abdominal   Peds  Hematology  (+) REFUSES BLOOD PRODUCTS, Plt 186k    Anesthesia Other Findings Day of surgery medications reviewed with the patient.  Reproductive/Obstetrics (+) Pregnancy                             Anesthesia Physical Anesthesia Plan  ASA: III  Anesthesia Plan: Epidural   Post-op Pain Management:    Induction:   PONV Risk Score and Plan: 2 and Treatment may vary due to age or medical condition  Airway Management Planned: Natural Airway  Additional Equipment:   Intra-op Plan:   Post-operative Plan:   Informed Consent: I have reviewed the patients History and Physical, chart, labs and discussed the procedure including the risks, benefits and alternatives for the proposed anesthesia with the patient or authorized representative who has indicated his/her understanding and acceptance.     Dental advisory given  Plan Discussed with:   Anesthesia Plan Comments: (Patient identified. Risks/Benefits/Options discussed with patient including but not limited to bleeding, infection, nerve damage, paralysis, failed block, incomplete pain control, headache, blood  pressure changes, nausea, vomiting, reactions to medication both or allergic, itching and postpartum back pain. Confirmed with bedside nurse the patient's most recent platelet count. Confirmed with patient that they are not currently taking any anticoagulation, have any bleeding history or any family history of bleeding disorders. Patient expressed understanding and wished to proceed. All questions were answered. )        Anesthesia Quick Evaluation

## 2019-07-17 NOTE — Progress Notes (Addendum)
Shacara Cozine is a 34 y.o. G1P0 at [redacted]w[redacted]d  admitted for IOL for postdates and found to have pre-e without severe features. She denies HA, blurry vision, floating spots, full body edema. Her blood pressures have not been severe range except for repositioning after epidural.   Subjective:  Resting in bed with eyes closed.    Objective: Vitals:   07/17/19 1301 07/17/19 1331 07/17/19 1401 07/17/19 1402  BP: (!) 93/30 119/65 135/78   Pulse: (!) 103 (!) 107 (!) 111   Resp: 18 16 18    Temp:   99.5 F (37.5 C) 99.5 F (37.5 C)  TempSrc:   Axillary Oral  SpO2:      Weight:      Height:       Total I/O In: -  Out: 100 [Urine:100]  FHT:  FHR: 160 bpm, variability: moderate,  accelerations:  Present,  decelerations:  Absent UC:   q2-3 SVE:   Dilation: 4 Effacement (%): 60 Station: -3 Exam by:: [email or web address], CNM Pitocin @ 20 mu/min  Labs: Lab Results  Component Value Date   WBC 8.9 07/17/2019   HGB 13.5 07/17/2019   HCT 40.7 07/17/2019   MCV 90.2 07/17/2019   PLT 186 07/17/2019    Patient Vitals for the past 24 hrs:  BP Temp Temp src Pulse Resp SpO2  07/17/19 1402 -- 99.5 F (37.5 C) Oral -- -- --  07/17/19 1401 135/78 99.5 F (37.5 C) Axillary (!) 111 18 --  07/17/19 1331 119/65 -- -- (!) 107 16 --  07/17/19 1301 (!) 93/30 -- -- (!) 103 18 --  07/17/19 1231 127/67 -- -- 97 16 --  07/17/19 1201 121/75 -- -- 97 16 --  07/17/19 1131 127/60 -- -- (!) 101 18 --  07/17/19 1101 126/76 -- -- 93 20 --  07/17/19 1031 126/64 98.4 F (36.9 C) Oral 90 18 --  07/17/19 1001 127/69 -- -- (!) 116 20 --  07/17/19 0936 126/76 -- -- 93 18 --  07/17/19 0935 -- -- -- -- -- 97 %  07/17/19 0931 117/66 -- -- 93 18 97 %  07/17/19 0926 121/62 -- -- 93 20 --  07/17/19 0925 -- -- -- -- -- 98 %  07/17/19 0921 (!) 138/101 -- -- (!) 101 20 --  07/17/19 0920 -- -- -- -- -- 99 %  07/17/19 0916 (!) 128/99 -- -- 93 20 100 %  07/17/19 0911 (!) 153/83 -- -- 90 18 98 %  07/17/19 0906 (!)  157/91 -- -- 92 20 99 %  07/17/19 0901 (!) 161/86 -- -- (!) 101 20 99 %  07/17/19 0856 (!) 158/90 -- -- 98 20 --  07/17/19 0853 (!) 155/89 -- -- 95 20 --  07/17/19 0850 (!) 162/104 -- -- (!) 103 20 --  07/17/19 0841 (!) 155/86 -- -- 96 20 --  07/17/19 0830 -- 98.1 F (36.7 C) Oral -- -- --  07/17/19 0801 (!) 162/86 -- -- 77 20 --  07/17/19 0732 (!) 173/79 -- -- 83 20 --  07/17/19 0700 (!) 159/86 -- -- 95 18 --  07/17/19 0629 (!) 143/94 -- -- 99 -- --  07/17/19 0627 -- 98.9 F (37.2 C) Oral -- -- --  07/17/19 0530 140/90 -- -- 90 18 --  07/17/19 0200 (!) 142/74 -- -- 90 16 --  07/17/19 0013 (!) 141/96 98 F (36.7 C) Oral 97 20 --  07/16/19 2257 (!) 156/87 -- -- (!) 101 -- --  07/16/19  2007 (!) 147/77 -- -- 100 18 --  07/16/19 1927 -- 98.3 F (36.8 C) Oral -- 18 --  07/16/19 1745 (!) 143/79 -- -- 98 18 --  07/16/19 1628 -- 98.5 F (36.9 C) Oral -- 18 --     Assessment / Plan: Patient resting; will not recheck cervix at this time. Continue to titrate pit. Temp is 99; continue to monitor as baseline has risen.  -no signs of pre-e at this time; BPs are normal   Labor: Progressing on Pitocin, will continue to increase then AROM Fetal Wellbeing:  Category I Pain Control:  Epidural Anticipated MOD:  NSVD  Mervyn Skeeters Coraline Talwar 07/17/2019, 2:31 PM

## 2019-07-17 NOTE — Progress Notes (Signed)
   Hannah Crosby is a 34 y.o. G1P0 at [redacted]w[redacted]d  admitted for IOL for postdates. She starting having elevated blood pressures while in labor, and was found to have elevated PCR (0.53), patient now meets criteria for pre-e without severe features.   Subjective:  Coping well; afebrile.  Objective: Vitals:   07/17/19 1631 07/17/19 1646 07/17/19 1647 07/17/19 1701  BP: 135/65   (!) 152/88  Pulse: (!) 106   (!) 108  Resp: 18   20  Temp:  98.8 F (37.1 C) 99.5 F (37.5 C)   TempSrc:  Oral Axillary   SpO2:      Weight:      Height:       Total I/O In: -  Out: 100 [Urine:100]  FHT:  FHR: 170 bpm, variability: moderate,  accelerations:  Present,  decelerations:  Present one late variable after contraction UC:   irregular, every 1-2 minutes SVE:   Dilation: 6(Simultaneous filing. User may not have seen previous data.) Effacement (%): 60 Station: -2 Exam by:: Luna Kitchens, CNM Pitocin @ 22 mu/min  Labs: Lab Results  Component Value Date   WBC 8.9 07/17/2019   HGB 13.5 07/17/2019   HCT 40.7 07/17/2019   MCV 90.2 07/17/2019   PLT 186 07/17/2019    Assessment / Plan: Patient AROM with light mec at 1720; IUPC placed Updated Dr. Vergie Living; will start amnioinfusion. Notified RN to start amnioinfusion and place FSE; will also reduce pitocin to 16 (down from 22).  Labor: active labor Fetal Wellbeing:  Category II Pain Control:  Epidural Anticipated MOD:  NSVD  Marylene Land 07/17/2019, 5:53 PM

## 2019-07-17 NOTE — Progress Notes (Addendum)
Labor Progress Note Hannah Crosby is a 34 y.o. G1P0 at [redacted]w[redacted]d presented for IOL for post dates.  S: Patient is comfortable and is planning on taking a nap. She does not endorse headaches, blurred vision, spots in vision, RUQ pain, or nausea and vomiting. She feels like her extremities are getting more swollen, but seem baseline for her.  O:  BP 135/78   Pulse (!) 111   Temp 99.5 F (37.5 C) (Oral)   Resp 18   Ht 5\' 2"  (1.575 m)   Wt 108.9 kg   LMP 10/02/2018   SpO2 97%   BMI 43.90 kg/m  EFM: FHR: 155 bpm, variability: moderate, accelerations present, decelerations: absent  CVE: Dilation: 4 Effacement (%): 60 Cervical Position: Posterior Station: -3 Presentation: Vertex Exam by:: [email or web address], CNM   A&P: 34 y.o. G1P0 [redacted]w[redacted]d here for IOL due to postdates.  #Labor: Progressing well. Pitocin at 18. Anticipating NSVD. #Pain: well-managed with epidural. #FWB: Category 1 #GBS: Negative Blood pressures remain normalized. Will continue to monitor for signs/symptoms of pre-eclampsia with severe features.  [redacted]w[redacted]d, Medical Student 2:22 PM   CNM attestation:  I have seen and examined this patient and agree with above documentation in the medical student's note.   Hannah Crosby is a 34 y.o. G1P0 at [redacted]w[redacted]d reporting here for IOL for postdates. Was found to have pre-e without severe features.   PE: Patient Vitals for the past 24 hrs:  BP Temp Temp src Pulse Resp SpO2  07/17/19 1431 (!) 143/77 -- -- (!) 107 18 --  07/17/19 1402 -- 99.5 F (37.5 C) Oral -- -- --  07/17/19 1401 135/78 99.5 F (37.5 C) Axillary (!) 111 18 --  07/17/19 1331 119/65 -- -- (!) 107 16 --  07/17/19 1301 (!) 93/30 -- -- (!) 103 18 --  07/17/19 1231 127/67 -- -- 97 16 --  07/17/19 1201 121/75 -- -- 97 16 --  07/17/19 1131 127/60 -- -- (!) 101 18 --  07/17/19 1101 126/76 -- -- 93 20 --  07/17/19 1031 126/64 98.4 F (36.9 C) Oral 90 18 --  07/17/19 1001 127/69 -- -- (!) 116 20 --  07/17/19  0936 126/76 -- -- 93 18 --  07/17/19 0935 -- -- -- -- -- 97 %  07/17/19 0931 117/66 -- -- 93 18 97 %  07/17/19 0926 121/62 -- -- 93 20 --  07/17/19 0925 -- -- -- -- -- 98 %  07/17/19 0921 (!) 138/101 -- -- (!) 101 20 --  07/17/19 0920 -- -- -- -- -- 99 %  07/17/19 0916 (!) 128/99 -- -- 93 20 100 %  07/17/19 0911 (!) 153/83 -- -- 90 18 98 %  07/17/19 0906 (!) 157/91 -- -- 92 20 99 %  07/17/19 0901 (!) 161/86 -- -- (!) 101 20 99 %  07/17/19 0856 (!) 158/90 -- -- 98 20 --  07/17/19 0853 (!) 155/89 -- -- 95 20 --  07/17/19 0850 (!) 162/104 -- -- (!) 103 20 --  07/17/19 0841 (!) 155/86 -- -- 96 20 --  07/17/19 0830 -- 98.1 F (36.7 C) Oral -- -- --  07/17/19 0801 (!) 162/86 -- -- 77 20 --  07/17/19 0732 (!) 173/79 -- -- 83 20 --  07/17/19 0700 (!) 159/86 -- -- 95 18 --  07/17/19 0629 (!) 143/94 -- -- 99 -- --  07/17/19 0627 -- 98.9 F (37.2 C) Oral -- -- --  07/17/19 0530 140/90 -- -- 90 18 --  07/17/19 0200 (!) 142/74 -- -- 90 16 --  07/17/19 0013 (!) 141/96 98 F (36.7 C) Oral 97 20 --  07/16/19 2257 (!) 156/87 -- -- (!) 101 -- --  07/16/19 2007 (!) 147/77 -- -- 100 18 --  07/16/19 1927 -- 98.3 F (36.8 C) Oral -- 18 --  07/16/19 1745 (!) 143/79 -- -- 98 18 --  07/16/19 1628 -- 98.5 F (36.9 C) Oral -- 18 --   Gen: calm comfortable, NAD Resp: normal effort, no distress Heart: Regular rate Abd: Soft, NT, gravid, S=D  FHR: Baseline 155, mod Variability, pos accels, no decels Toco: UC's Q 2-4 min  ROS, labs, PMH reviewed   Assessment: 1. Vitamin D deficiency   2. Supervision of normal first pregnancy, antepartum   3. Obesity affecting pregnancy, antepartum   4. Excessive weight gain during pregnancy, antepartum     Plan: -Continue to watch BP; recheck cervix this afternoon and consider AROM.  Starr Lake, [place] 07/17/2019 2:53 PM

## 2019-07-17 NOTE — Progress Notes (Signed)
   Grizelda Piscopo is a 34 y.o. G1P0 at [redacted]w[redacted]d  admitted for IOL for postdates and new onset pre-e without SF.   Subjective: Feeling pressure, lying on her left side.   Objective: Vitals:   07/17/19 1646 07/17/19 1647 07/17/19 1701 07/17/19 1755  BP:   (!) 152/88   Pulse:   (!) 108   Resp:   20   Temp: 98.8 F (37.1 C) 99.5 F (37.5 C)  (!) 100.6 F (38.1 C)  TempSrc: Oral Axillary  Axillary  SpO2:      Weight:      Height:       Total I/O In: -  Out: 100 [Urine:100]  FHT:  FHR: 160 bpm, variability: moderate,  accelerations:  Present,  decelerations:  Absent UC:   Regular q 1-2 minutes SVE:   Dilation: 6(Simultaneous filing. User may not have seen previous data.) Effacement (%): 60 Station: -2 Exam by:: Luna Kitchens, CNM Pitocin @ 0 mu/min MVUs are 120-130 Labs: Lab Results  Component Value Date   WBC 8.9 07/17/2019   HGB 13.5 07/17/2019   HCT 40.7 07/17/2019   MCV 90.2 07/17/2019   PLT 186 07/17/2019    Assessment / Plan: Patient will minimal to moderate variabilit and now with temp of 100.6. 2nd fluid bolus infusing; pitocin has been d/c and will start antibiotics. IUPC, FSE, and epidural in place.  MVUs not adequate; will continue pitocin break for until 1930 and then restart at 12 mu.  Labor: active labor Fetal Wellbeing:  Category II Pain Control:  Epidural Anticipated MOD:  NSVD  Charlesetta Garibaldi Rodger Giangregorio 07/17/2019, 6:02 PM

## 2019-07-18 ENCOUNTER — Encounter (HOSPITAL_COMMUNITY): Payer: Self-pay | Admitting: Obstetrics & Gynecology

## 2019-07-18 DIAGNOSIS — O368330 Maternal care for abnormalities of the fetal heart rate or rhythm, third trimester, not applicable or unspecified: Secondary | ICD-10-CM

## 2019-07-18 DIAGNOSIS — O411290 Chorioamnionitis, unspecified trimester, not applicable or unspecified: Secondary | ICD-10-CM | POA: Diagnosis not present

## 2019-07-18 DIAGNOSIS — Z3A41 41 weeks gestation of pregnancy: Secondary | ICD-10-CM

## 2019-07-18 DIAGNOSIS — O1404 Mild to moderate pre-eclampsia, complicating childbirth: Secondary | ICD-10-CM

## 2019-07-18 DIAGNOSIS — O480 Post-term pregnancy: Principal | ICD-10-CM

## 2019-07-18 LAB — CBC
HCT: 37.8 % (ref 36.0–46.0)
Hemoglobin: 12.3 g/dL (ref 12.0–15.0)
MCH: 30.1 pg (ref 26.0–34.0)
MCHC: 32.5 g/dL (ref 30.0–36.0)
MCV: 92.4 fL (ref 80.0–100.0)
Platelets: 166 10*3/uL (ref 150–400)
RBC: 4.09 MIL/uL (ref 3.87–5.11)
RDW: 14.4 % (ref 11.5–15.5)
WBC: 17.8 10*3/uL — ABNORMAL HIGH (ref 4.0–10.5)
nRBC: 0 % (ref 0.0–0.2)

## 2019-07-18 MED ORDER — FENTANYL CITRATE (PF) 100 MCG/2ML IJ SOLN
INTRAMUSCULAR | Status: DC | PRN
  Administered 2019-07-18: 100 ug via EPIDURAL

## 2019-07-18 MED ORDER — SIMETHICONE 80 MG PO CHEW: 80.0000 mg | CHEWABLE_TABLET | ORAL | Status: DC | PRN

## 2019-07-18 MED ORDER — PRENATAL MULTIVITAMIN CH
1.0000 | ORAL_TABLET | Freq: Every day | ORAL | Status: DC
  Administered 2019-07-19 – 2019-07-20 (×2): 1 via ORAL
  Filled 2019-07-18 (×2): qty 1

## 2019-07-18 MED ORDER — LIDOCAINE-EPINEPHRINE (PF) 2 %-1:200000 IJ SOLN
INTRAMUSCULAR | Status: AC
  Filled 2019-07-18: qty 10

## 2019-07-18 MED ORDER — DIPHENHYDRAMINE HCL 25 MG PO CAPS: 25.0000 mg | ORAL_CAPSULE | Freq: Four times a day (QID) | ORAL | Status: DC | PRN

## 2019-07-18 MED ORDER — MAGNESIUM HYDROXIDE 400 MG/5ML PO SUSP
30.0000 mL | Freq: Every day | ORAL | Status: DC
  Administered 2019-07-19 – 2019-07-20 (×2): 30 mL via ORAL
  Filled 2019-07-18 (×2): qty 30

## 2019-07-18 MED ORDER — DIBUCAINE (PERIANAL) 1 % EX OINT: 1.0000 "application " | TOPICAL_OINTMENT | CUTANEOUS | Status: DC | PRN

## 2019-07-18 MED ORDER — LIDOCAINE-EPINEPHRINE (PF) 2 %-1:200000 IJ SOLN
INTRAMUSCULAR | Status: DC | PRN
  Administered 2019-07-18: 5 mL via EPIDURAL

## 2019-07-18 MED ORDER — FENTANYL CITRATE (PF) 100 MCG/2ML IJ SOLN
INTRAMUSCULAR | Status: AC
  Filled 2019-07-18: qty 2

## 2019-07-18 MED ORDER — ONDANSETRON HCL 4 MG PO TABS: 4.0000 mg | ORAL_TABLET | ORAL | Status: DC | PRN

## 2019-07-18 MED ORDER — ONDANSETRON HCL 4 MG/2ML IJ SOLN: 4.0000 mg | INTRAMUSCULAR | Status: DC | PRN

## 2019-07-18 MED ORDER — WITCH HAZEL-GLYCERIN EX PADS: 1.0000 "application " | MEDICATED_PAD | CUTANEOUS | Status: DC | PRN

## 2019-07-18 MED ORDER — ZOLPIDEM TARTRATE 5 MG PO TABS: 5.0000 mg | ORAL_TABLET | Freq: Every evening | ORAL | Status: DC | PRN

## 2019-07-18 MED ORDER — TETANUS-DIPHTH-ACELL PERTUSSIS 5-2.5-18.5 LF-MCG/0.5 IM SUSP: 0.5000 mL | Freq: Once | INTRAMUSCULAR | Status: DC

## 2019-07-18 MED ORDER — ACETAMINOPHEN 325 MG PO TABS
650.0000 mg | ORAL_TABLET | ORAL | Status: DC | PRN
  Administered 2019-07-18 – 2019-07-19 (×2): 650 mg via ORAL
  Filled 2019-07-18: qty 2

## 2019-07-18 MED ORDER — SODIUM CHLORIDE 0.9 % IV SOLN
2.0000 g | Freq: Four times a day (QID) | INTRAVENOUS | Status: AC
  Administered 2019-07-18 – 2019-07-19 (×3): 2 g via INTRAVENOUS
  Filled 2019-07-18: qty 2000
  Filled 2019-07-18: qty 2
  Filled 2019-07-18 (×2): qty 2000

## 2019-07-18 MED ORDER — SENNOSIDES-DOCUSATE SODIUM 8.6-50 MG PO TABS
2.0000 | ORAL_TABLET | ORAL | Status: DC
  Administered 2019-07-18 – 2019-07-19 (×2): 2 via ORAL
  Filled 2019-07-18 (×2): qty 2

## 2019-07-18 MED ORDER — BENZOCAINE-MENTHOL 20-0.5 % EX AERO
1.0000 "application " | INHALATION_SPRAY | CUTANEOUS | Status: DC | PRN
  Administered 2019-07-18 – 2019-07-20 (×3): 1 via TOPICAL
  Filled 2019-07-18 (×3): qty 56

## 2019-07-18 MED ORDER — GENTAMICIN SULFATE 40 MG/ML IJ SOLN
5.0000 mg/kg | INTRAVENOUS | Status: AC
  Administered 2019-07-18: 370 mg via INTRAVENOUS
  Filled 2019-07-18: qty 9.25

## 2019-07-18 MED ORDER — IBUPROFEN 600 MG PO TABS
600.0000 mg | ORAL_TABLET | Freq: Four times a day (QID) | ORAL | Status: DC
  Administered 2019-07-18 – 2019-07-20 (×10): 600 mg via ORAL
  Filled 2019-07-18 (×10): qty 1

## 2019-07-18 MED ORDER — COCONUT OIL OIL
1.0000 "application " | TOPICAL_OIL | Status: DC | PRN
  Administered 2019-07-18 (×2): 1 via TOPICAL

## 2019-07-18 NOTE — Progress Notes (Signed)
Feeling more vaginal pressure SVE: 9.5 (left rim)/100/+1 FHR: 150, mod var, 10x10 accels, one variable  Making progress on Pitocin, will recheck in 1 hour Hopeful for vaginal delivery  Marleny Faller, Margarette Asal, DO OB Fellow, Faculty Practice 07/18/2019 4:33 AM

## 2019-07-18 NOTE — Discharge Summary (Signed)
Postpartum Discharge Summary      Patient Name: Hannah Crosby DOB: 04/13/1985 MRN: [id number]  Date of admission: 07/16/2019 Delivery date:07/18/2019  Delivering provider: Aletha Halim  Date of discharge: 07/20/2019  Admitting diagnosis: Post term pregnancy over 40 weeks [O48.0] Intrauterine pregnancy: [redacted]w[redacted]d    Secondary diagnosis:  Active Problems:   Post term pregnancy over 40 weeks   Preeclampsia   Chorioamnionitis, delivered, current hospitalization  Additional problems: None    Discharge diagnosis: Term Pregnancy Delivered, Preeclampsia (mild) and Forceps-Assisted Vaginal Delivery                                              Post partum procedures:None Augmentation: AROM, Pitocin, Cytotec and IP Foley Complications: Intrauterine Inflammation or infection (Chorioamniotis)  Hospital course: Induction of Labor With Vaginal Delivery   34y.o. yo G1P0 at 464w2das admitted to the hospital 07/16/2019 for induction of labor.  Indication for induction: Postdates.  Patient's labor course as follows:  Patient was admitted for post dates induction at 4140 weeksHer induction was started with FB and cytotec. She was eventually started on Pitocin and AROMed (with meconium-stained fluid). She developed a fever and was started on Amp/Gent for presumed Triple I, FSE and IUPC were placed with amnioinfusion. She had a pitocin break from 1800-2130 on 07/17/19, then pitocin was resumed. She progressed to complete, labored down for about an hour, and started pushing. She was +2 station, and due to minimal variability, late decelerations, and fetal tachycardia an operative vaginal delivery was offered.  Membrane Rupture Time/Date: 5:22 PM ,07/17/2019   Delivery Method:Vaginal, Forceps  Episiotomy: None  Lacerations:  3rd degree;Sulcus sulcus, 3A perineal laceration Details of delivery can be found in separate delivery note.  Patient had a routine postpartum course. Patient is discharged home  07/20/19.  Newborn Data: Birth date:07/18/2019  Birth time:7:36 AM  Gender:Female  Living status:Living  Apgars:2 ,4  Weight:3310 g   Magnesium Sulfate received: No BMZ received: No Rhophylac:N/A MMR:N/A T-DaP:Given prenatally Flu: N/A Transfusion:No  Physical exam  Vitals:   07/19/19 0820 07/19/19 1330 07/19/19 2025 07/20/19 0528  BP: 104/74 115/86 127/80 129/89  Pulse: 92 86 89 82  Resp: '20 20 18 18  ' Temp: 98.7 F (37.1 C) 98.1 F (36.7 C) 98.4 F (36.9 C) 97.9 F (36.6 C)  TempSrc: Oral Oral Oral Oral  SpO2: 98%  100% 100%  Weight:      Height:       General: alert, cooperative and no distress Lochia: appropriate Uterine Fundus: firm Incision: N/A DVT Evaluation: No evidence of DVT seen on physical exam. Labs: Lab Results  Component Value Date   WBC 13.0 (H) 07/19/2019   HGB 11.1 (L) 07/19/2019   HCT 33.7 (L) 07/19/2019   MCV 93.1 07/19/2019   PLT 144 (L) 07/19/2019   CMP Latest Ref Rng & Units 07/16/2019  Glucose 70 - 99 mg/dL 112(H)  BUN 6 - 20 mg/dL 6  Creatinine 0.44 - 1.00 mg/dL 0.57  Sodium 135 - 145 mmol/L 135  Potassium 3.5 - 5.1 mmol/L 3.7  Chloride 98 - 111 mmol/L 107  CO2 22 - 32 mmol/L 19(L)  Calcium 8.9 - 10.3 mg/dL 9.2  Total Protein 6.5 - 8.1 g/dL 6.5  Total Bilirubin 0.3 - 1.2 mg/dL 0.3  Alkaline Phos 38 - 126 U/L 89  AST 15 -  41 U/L 28  ALT 0 - 44 U/L 23   Edinburgh Score: Edinburgh Postnatal Depression Scale Screening Tool 07/19/2019  I have been able to laugh and see the funny side of things. 0  I have looked forward with enjoyment to things. 0  I have blamed myself unnecessarily when things went wrong. 0  I have been anxious or worried for no good reason. 0  I have felt scared or panicky for no good reason. 0  Things have been getting on top of me. 0  I have been so unhappy that I have had difficulty sleeping. 0  I have felt sad or miserable. 0  I have been so unhappy that I have been crying. 0  The thought of harming myself has  occurred to me. 0  Edinburgh Postnatal Depression Scale Total 0     After visit meds:  Allergies as of 07/20/2019      Reactions   Banana Anaphylaxis      Medication List    STOP taking these medications   aspirin 81 MG chewable tablet Commonly known as: Aspirin Childrens     TAKE these medications   acetaminophen 325 MG tablet Commonly known as: Tylenol Take 2 tablets (650 mg total) by mouth every 4 (four) hours as needed (for pain scale < 4).   albuterol 108 (90 Base) MCG/ACT inhaler Commonly known as: VENTOLIN HFA Inhale 1-2 puffs into the lungs every 6 (six) hours as needed for wheezing or shortness of breath.   Blood Pressure Cuff Misc 1 Device by Does not apply route as needed.   DHA PO Take 1 capsule by mouth daily.   ibuprofen 600 MG tablet Commonly known as: ADVIL Take 1 tablet (600 mg total) by mouth every 6 (six) hours.   magnesium hydroxide 400 MG/5ML suspension Commonly known as: MILK OF MAGNESIA Take 30 mLs by mouth daily.   One-A-Day Womens Prenatal 1 28-0.8-235 MG Caps Take 1 tablet by mouth daily.   senna-docusate 8.6-50 MG tablet Commonly known as: Senokot-S Take 2 tablets by mouth daily. Start taking on: July 21, 2019   Vitamin D (Cholecalciferol) 50 MCG (2000 UT) Caps Take 4,000 Units by mouth daily.   Wrist Brace/Left Small Misc 1 Device by Does not apply route daily.   Wrist Brace/Right Small Misc 1 Device by Does not apply route daily.        Discharge home in stable condition Infant Feeding: Bottle Infant Disposition:per NICU, most likely home with mom Discharge instruction: per After Visit Summary and Postpartum booklet. Activity: Advance as tolerated. Pelvic rest for 6 weeks.  Diet: routine diet Future Appointments:No future appointments. Follow up Visit:  Please schedule this patient for a In person postpartum visit in 4 weeks with the following provider: MD. Additional Postpartum F/U: 3A laceration check in office in 1  week  Low risk pregnancy complicated by: post dates, intrapartum Pre-E w/o severe features, forceps-assisted vaginal delivery Delivery mode:  Vaginal, Forceps  Anticipated Birth Control:  ask at postpartum visit   07/20/2019 Merilyn Baba, DO

## 2019-07-18 NOTE — Progress Notes (Signed)
Patient alert and rested. Instructed on pumping her breast, cleaning equipment, frequency of pumping and storage. NNP present and discussed that baby can go to breast when showing feeding cues. Mother was instructed to call for assistance with latching. Pt up to bathroom, tolerated OOB well.

## 2019-07-18 NOTE — Progress Notes (Signed)
Patient transferred from [facility] to [facility] to room in with her baby who is under NICU care. Patient is excited to be with her baby and all of her attention is focused on baby at this time. Oriented to room, patient ordered food, asked questions to NICU nurse about baby then fell asleep.

## 2019-07-18 NOTE — Progress Notes (Addendum)
Hannah Crosby is a 34 y.o. G1P0 at [redacted]w[redacted]d admitted for post dates, now with Pre-E w/o severe features. Due date6/7/21.  Subjective: Feeling more pressure in her bottom  Objective: BP (!) 143/95   Pulse 99   Temp 99.6 F (37.6 C) (Oral)   Resp 18   Ht 5\' 2"  (1.575 m)   Wt 108.9 kg   LMP 10/02/2018   SpO2 97%   BMI 43.90 kg/m  Total I/O In: -  Out: 425 [Urine:425]  FHT:  FHR: 155-160 bpm, variability: minimal-moderate,  accelerations:  Abscent,  decelerations:  Absent UC:   regular, every 2-3 minutes  SVE:   Dilation: 9 Effacement (%): 100 Station: 0 Exam by:: Dr. [email or web address]   Pitocin @ 12 mu/min MVUs ~160  Labs: Lab Results  Component Value Date   WBC 8.9 07/17/2019   HGB 13.5 07/17/2019   HCT 40.7 07/17/2019   MCV 90.2 07/17/2019   PLT 186 07/17/2019    Assessment / Plan: 34 yo G1P0 at 41.2 EGA here for IOL 2/2 post dates, now with Pre-E w/o severe features  Labor: -s/p cyto x5, FB. Was started on Pitocin at 0623 this AM, pit break at 1756 since MVUs were 80 (not adequate). Pitocin restarted at 2130, now at 12, making cervical change with aggressive position changes. -Triple I, on amp/gent. Most recent temp 99.6*F -IUPC with amnioinfusion -Will continue with Pitocin and aggressive pitocin changes, not very much molding on cervical exam Fetal Wellbeing:Category II, reassuring for occasional moderate variability, positive scalp stim Pain Control:Epidural Pre-eclampsia:no severe range pressures I/D:GBS negative Anticipated 2131 for vaginal delivery, CS as appropriate  ZMO:[id number] DO OB Fellow, Faculty Practice 07/18/2019, 3:11 AM

## 2019-07-18 NOTE — Anesthesia Postprocedure Evaluation (Signed)
Anesthesia Post Note  Patient: Sita Mangen  Procedure(s) Performed: AN AD HOC LABOR EPIDURAL     Patient location during evaluation: Mother Baby Anesthesia Type: Epidural Level of consciousness: awake and alert Pain management: pain level controlled Vital Signs Assessment: post-procedure vital signs reviewed and stable Respiratory status: spontaneous breathing, nonlabored ventilation and respiratory function stable Cardiovascular status: stable Postop Assessment: no headache, no backache, epidural receding, no apparent nausea or vomiting, patient able to bend at knees, adequate PO intake and able to ambulate Anesthetic complications: no    Last Vitals:  Vitals:   07/18/19 1013 07/18/19 1113  BP: 115/76 120/82  Pulse: 95 78  Resp: 18 20  Temp: 37.5 C 37.6 C  SpO2: 100% 97%    Last Pain:  Vitals:   07/18/19 1147  TempSrc:   PainSc: 4    Pain Goal: Patients Stated Pain Goal: 3 (07/18/19 1147)                 Keisha Amer Hristova

## 2019-07-18 NOTE — Progress Notes (Signed)
FHR: 155, mod var, no accels, one 2-minute prolonged decel while patient was on her back  SVE: 10/100/+2  Will labor down for 1 hour, as FHR drops when patient is on her back. Plan to push on her side  Anticipate vaginal delivery  Marlowe Alt, DO OB Fellow, Faculty Practice 07/18/2019 5:38 AM

## 2019-07-18 NOTE — Lactation Note (Signed)
This note was copied from a baby's chart. Lactation Consultation Note  Patient Name: Hannah Crosby [id number] Date: 07/18/2019 Reason for consult: Initial assessment;Primapara;1st time breastfeeding;Term  Baby is 14 hours old of a P1 mother in couplet care at the moment. DEBP was set up by RN and mother reports using a couple of times since set up but she is getting nothing so far.   Mother shared her intention to breastfeed and discussed DEBP for proper stimulation and to establish good milk supply. Reviewed information about frequency, cleaning and milk storage. Encouraged to pump 8-12 times in a 24-hour period and feed baby everything she pumps.    Reviewed breastfeeding basics. Discussed milk coming to volume. Reviewed NBN behavior and second day expectations with parents and encouraged to contact LC for support when ready to breastfeed baby as instructed by providers and recommended to request help for questions or concerns.    All questions answered at this time.   Maternal Data Formula Feeding for Exclusion: No Does the patient have breastfeeding experience prior to this delivery?: No  Feeding Feeding Type: Breast Fed  LATCH Score Latch: Repeated attempts needed to sustain latch, nipple held in mouth throughout feeding, stimulation needed to elicit sucking reflex.  Audible Swallowing: A few with stimulation  Type of Nipple: Flat  Comfort (Breast/Nipple): Soft / non-tender  Hold (Positioning): No assistance needed to correctly position infant at breast.  LATCH Score: 7  Interventions Interventions: Breast feeding basics reviewed;Skin to skin;Hand express;DEBP  Lactation Tools Discussed/Used Tools: Pump Pump Review: Setup, frequency, and cleaning;Milk Storage;Other (comment)(Pump set up and running by RN prior to [facility] visit)   Consult Status Consult Status: Follow-up Date: 07/19/19 Follow-up type: In-patient    Saphire Barnhart A Higuera Ancidey 07/18/2019, 10:35  PM

## 2019-07-18 NOTE — Progress Notes (Addendum)
Hannah Crosby is a 34 y.o. G1P0 at [redacted]w[redacted]d admitted for Post dates, now with Pre-E w/o severe features. Due date 07/17/19.  Subjective: Feeling more pressure in her bottom  Objective: BP 130/73 (BP Location: Left Arm)   Pulse 94   Temp 98.6 F (37 C) (Oral)   Resp 16   Ht 5\' 2"  (1.575 m)   Wt 108.9 kg   LMP 10/02/2018   SpO2 97%   BMI 43.90 kg/m  Total I/O In: -  Out: 375 [Urine:375]  FHT:  FHR: 155-160 bpm, variability: moderate,  accelerations:  Abscent,  decelerations:  Present occasional variables UC:   irregular, every 2-4 minutes  SVE:   Dilation: 8 Effacement (%): 90 Station: -1, 0 Exam by:: Dr. [email or web address]   Pitocin @ 10 mu/min MVUs ~140  Labs: Lab Results  Component Value Date   WBC 8.9 07/17/2019   HGB 13.5 07/17/2019   HCT 40.7 07/17/2019   MCV 90.2 07/17/2019   PLT 186 07/17/2019    Assessment / Plan: 34 yo G1P0 at 41.2 EGA here for IOL 2/2 post dates, now with Pre-E w/o severe features  Labor:  -s/p cyto x5, FB. Was started on Pitocin at 0623 this AM, pit break at 1756 since MVUs were 80 (not adequate). Pitocin restarted at 2130, making cervical change with aggressive position changes. -Triple I, on amp/gent -IUPC with amnioinfusion -Case discussed with Dr. 2131, plan to recheck at 0300 -Will continue with Pitocin and aggressive pitocin changes, not very much molding on cervical exam. Dr. Vergie Living made aware Fetal Wellbeing:  Category II, reassuring for moderate variability Pain Control:  Epidural Pre-eclampsia: no severe range pressures I/D:  GBS negative Anticipated MOD:  Hopeful for vaginal delivery, CS as appropriate  Vergie Living DO OB Fellow, Faculty Practice 07/18/2019, 1:19 AM

## 2019-07-18 NOTE — Discharge Instructions (Signed)

## 2019-07-19 ENCOUNTER — Telehealth: Payer: Self-pay | Admitting: *Deleted

## 2019-07-19 LAB — CBC
HCT: 33.7 % — ABNORMAL LOW (ref 36.0–46.0)
Hemoglobin: 11.1 g/dL — ABNORMAL LOW (ref 12.0–15.0)
MCH: 30.7 pg (ref 26.0–34.0)
MCHC: 32.9 g/dL (ref 30.0–36.0)
MCV: 93.1 fL (ref 80.0–100.0)
Platelets: 144 10*3/uL — ABNORMAL LOW (ref 150–400)
RBC: 3.62 MIL/uL — ABNORMAL LOW (ref 3.87–5.11)
RDW: 14.5 % (ref 11.5–15.5)
WBC: 13 10*3/uL — ABNORMAL HIGH (ref 4.0–10.5)
nRBC: 0 % (ref 0.0–0.2)

## 2019-07-19 NOTE — Lactation Note (Signed)
This note was copied from a baby's chart. Lactation Consultation Note  Patient Name: Hannah Crosby [id number] Date: 07/19/2019 Reason for consult: Follow-up assessment;1st time breastfeeding;NICU baby;Primapara;Term  LC in to visit with P1 Mom of baby in the NICU.  Mom was set up with a DEBP and states she has pumped twice.  Baby has been to the breast 4 times, followed by formula by bottle.    Encouraged keeping baby STS as much as possible.    Recommended Mom double pump after each breastfeeding or attempt while baby is transitioning.   Reviewed hand expression, Mom thrilled which drops expressed.  LC to assist with next feeding at the breast.  Interventions Interventions: Breast feeding basics reviewed;Skin to skin;Breast massage;Hand express;DEBP  Lactation Tools Discussed/Used Tools: Pump Breast pump type: Double-Electric Breast Pump   Consult Status Consult Status: Follow-up Date: 07/20/19 Follow-up type: In-patient    [name] 07/19/2019, 10:08 AM

## 2019-07-19 NOTE — Telephone Encounter (Signed)
Left patient an urgent message to call and schedule appointments as soon as possible.

## 2019-07-19 NOTE — Telephone Encounter (Signed)
-----   Message from Marlowe Alt, DO sent at 07/18/2019  8:47 AM EDT ----- Regarding: PostPartum Appt Please schedule this patient for a In person postpartum visit in 4 weeks with the following provider: MD. Additional Postpartum F/U: 3A laceration check in office in 1 week  Low risk pregnancy complicated by: post dates, intrapartum Pre-E w/o severe features, forceps-assisted vaginal delivery Delivery mode:  Vaginal, Forceps  Anticipated Birth Control:  ask at postpartum visit

## 2019-07-19 NOTE — Lactation Note (Signed)
This note was copied from a baby's chart. Lactation Consultation Note  Patient Name: Hannah Crosby [id number] Date: 07/19/2019 Reason for consult: Follow-up assessment;1st time breastfeeding;NICU baby;Primapara;Term  LC in to assist with positioning and latching baby to the breast.  Baby just was bathed.  Placed baby STS in football hold and then cross cradle hold.  Mom reminded to support her breast back from the areola and nipple.  Baby opened widely and assisted Mom in bringing baby quickly to breast.  Lower jaw extensions noted.  Baby continued to breastfeed without stimulation for 15 mins.  Mom denied any pain with latch.   Encouraged keeping baby STS and offering breast with cues.  Talked about normal newborn feeding patterns.    Mom to double pump if baby needs a supplement of formula.   Mom aware.  Mom very appreciative of teaching.  Feeding Feeding Type: Breast Fed  LATCH Score Latch: Grasps breast easily, tongue down, lips flanged, rhythmical sucking.  Audible Swallowing: Spontaneous and intermittent  Type of Nipple: Everted at rest and after stimulation  Comfort (Breast/Nipple): Soft / non-tender  Hold (Positioning): Assistance needed to correctly position infant at breast and maintain latch.  LATCH Score: 9  Interventions Interventions: Breast feeding basics reviewed;Assisted with latch;Skin to skin;Breast massage;Hand express;Breast compression;Adjust position;Support pillows;Position options;DEBP  Lactation Tools Discussed/Used Tools: Pump Breast pump type: Double-Electric Breast Pump   Consult Status Consult Status: Follow-up Date: 07/20/19 Follow-up type: In-patient    Hannah Crosby 07/19/2019, 11:05 AM

## 2019-07-19 NOTE — Progress Notes (Signed)
Patient screened out for psychosocial assessment since none of the following apply:  Psychosocial stressors documented in mother or baby's chart  Gestation less than 32 weeks  Code at delivery   Infant with anomalies Please contact the Clinical Social Worker if specific needs arise, by MOB's request, or if MOB scores greater than 9/yes to question 10 on Edinburgh Postpartum Depression Screen.  Walfred Bettendorf, LCSW Clinical Social Worker [facility] Cell#: [phone]     

## 2019-07-19 NOTE — Progress Notes (Addendum)
Post Partum Day 1  Subjective:  Hannah Crosby is a 34 y.o. G1P1001 [redacted]w[redacted]d s/p FAVD.  No acute events overnight.  Pt denies problems with ambulating, voiding or po intake.  She denies nausea or vomiting.  Pain is moderately controlled. Patient does not report flatus, and  has not had bowel movement.  Lochia is normal, bleeding is decreasing. Plan for birth control is  undecided  as patient would like to make decision at [facility] follow up visit.  Method of Feeding: Breastfeeding. Patient denies headaches, RUQ pain, dizziness, blurry/spotty vision. Patient does endorse continued swelling in lower extremities and shooting pains in her left neck without headache.    Objective: BP 103/66 (BP Location: Right Arm)   Pulse 88   Temp 98.4 F (36.9 C) (Oral)   Resp 18   Ht 5\' 2"  (1.575 m)   Wt 108.9 kg   LMP 10/02/2018   SpO2 100%   Breastfeeding Unknown   BMI 43.90 kg/m   Physical Exam:  General: alert, cooperative and no distress Lochia:normal flow Chest: No increased work of breathing, or cyanosis Heart: Regular rate and rhythm  Abdomen: deferred Uterine Fundus: deferred DVT Evaluation: No evidence of DVT seen on physical exam. Extremities: moderate edema in lower extremities  Recent Labs    07/18/19 0848 07/19/19 0624  HGB 12.3 11.1*  HCT 37.8 33.7*    Assessment/Plan:  ASSESSMENT: Hannah Crosby is a 34 y.o. G1P1001 [redacted]w[redacted]d ppd #1 FAVD doing well. She is currently hemodynamically stable and is no longer concerning for infection.   Plan for discharge tomorrow, offer lactation services as needed.   Monitor blood pressures and rx if needed.    LOS: 3 days     RESIDENT ATTESTATION OF STUDENT NOTE   I have seen and examined this patient.    I have discussed the findings and exam with the medical student and agree with the above note, which I have edited appropriately. I helped develop the management plan that is described in the student's note, and I agree with the content.    [redacted]w[redacted]d, MD 07/19/2019, 8:48 AM   GME ATTESTATION:  I saw and evaluated the patient. I agree with the findings and the plan of care as documented in the resident's note.  09/18/2019, DO OB Fellow, Faculty [facility], Center for [facility] 07/19/2019 8:12 PM

## 2019-07-20 LAB — SURGICAL PATHOLOGY

## 2019-07-20 MED ORDER — SENNOSIDES-DOCUSATE SODIUM 8.6-50 MG PO TABS: 2.0000 | ORAL_TABLET | ORAL | 1 refills | Status: AC

## 2019-07-20 MED ORDER — MAGNESIUM HYDROXIDE 400 MG/5ML PO SUSP: 30.0000 mL | Freq: Every day | ORAL | 0 refills | Status: DC

## 2019-07-20 MED ORDER — ACETAMINOPHEN 325 MG PO TABS: 650.0000 mg | ORAL_TABLET | ORAL | 0 refills | Status: AC | PRN

## 2019-07-20 MED ORDER — IBUPROFEN 600 MG PO TABS: 600.0000 mg | ORAL_TABLET | Freq: Four times a day (QID) | ORAL | 0 refills | Status: DC

## 2019-07-20 NOTE — Plan of Care (Signed)
Discharge teaching given to patient with her mother at bedside. Pt is receptive to all teaching and AVS given.

## 2019-07-20 NOTE — Lactation Note (Addendum)
This note was copied from a baby's chart. Lactation Consultation Note  Patient Name: Hannah Crosby [id number] Date: 07/20/2019 Reason for consult: Follow-up assessment;Primapara;1st time breastfeeding;NICU baby;Term  LC in to visit with P1 Mom of term baby in the NICU.  Mom has been primarily bottle feeding baby formula.  Encouraged pumping but Mom hasn't been per RN.    Encouraged Mom to ask for assistance with next breastfeeding.  Baby does have an identified short lingual frenulum which may be making his latching more difficult.  Showed Mom a nipple shield which may be helpful with latch.    Mom aware of lactation assistance available.   Interventions Interventions: Breast feeding basics reviewed;Skin to skin;Breast massage;Hand express;DEBP  Lactation Tools Discussed/Used Tools: Pump;Bottle Breast pump type: Double-Electric Breast Pump   Consult Status Consult Status: Follow-up Date: 07/21/19 Follow-up type: In-patient    [name] 07/20/2019, 1:42 PM

## 2019-07-21 ENCOUNTER — Telehealth (HOSPITAL_COMMUNITY): Payer: Self-pay

## 2019-07-21 ENCOUNTER — Ambulatory Visit: Payer: Self-pay

## 2019-07-21 ENCOUNTER — Other Ambulatory Visit: Payer: Self-pay | Admitting: Family Medicine

## 2019-07-21 DIAGNOSIS — R52 Pain, unspecified: Secondary | ICD-10-CM

## 2019-07-21 MED ORDER — DIBUCAINE (PERIANAL) 1 % EX OINT: 1.0000 "application " | TOPICAL_OINTMENT | CUTANEOUS | 1 refills | Status: AC | PRN

## 2019-07-21 NOTE — Telephone Encounter (Signed)
Mom reports she and baby have both been d/c today and she wanted a [facility] loaner pump.  Mom reports she forgot to get it before she left and came back to get one.  LC issued [employer] loaner pump to mom in lobby.

## 2019-07-21 NOTE — Lactation Note (Signed)
This note was copied from a baby's chart. Lactation Consultation Note  Patient Name: Hannah Crosby [id number] Date: 07/21/2019 Reason for consult: Follow-up assessment  P1 mother whose infant is now 23 hours old.  This is a term baby at 41+2 weeks.  Mother desires to only formula feed while in the hospital.  She will "try" to breast feed when she gets home.  Mother has not been pumping consistently.  Explained the importance and benefits of pumping consistently and incorporating hand expression with pumping.  Mother verbalized understanding; not sure if she will continue with breast feeding at this time.  Informed her that if she is not interested in latching baby to the breast another alternative would be to pump and bottle feed.  Mother may consider this option.  Mother does not desire any help at this time.  Mother will obtain her DEBP from the [facility] office on Monday.  She is aware of our [facility] loaner program here and may consider renting a pump if she is discharged prior to Monday.  She is aware of the cost of $30 to rent.  Support person present.  Encouraged mother to call for any questions/concerns.  RN updated.   Maternal Data    Feeding Feeding Type: Formula Nipple Type: Other (Avent #0)  LATCH Score                   Interventions    Lactation Tools Discussed/Used     Consult Status Consult Status: Follow-up Date: 07/21/19 Follow-up type: In-patient    Hannah Crosby 07/21/2019, 10:38 AM

## 2019-07-21 NOTE — Progress Notes (Signed)
Nupercainal sent for pain related to 3A lac  Adasia Hoar, Margarette Asal, DO OB Fellow, Faculty Practice 07/21/2019 7:32 AM

## 2019-08-04 ENCOUNTER — Ambulatory Visit (INDEPENDENT_AMBULATORY_CARE_PROVIDER_SITE_OTHER): Payer: Medicaid Other | Admitting: Certified Nurse Midwife

## 2019-08-04 ENCOUNTER — Encounter: Payer: Self-pay | Admitting: Certified Nurse Midwife

## 2019-08-04 ENCOUNTER — Other Ambulatory Visit: Payer: Self-pay

## 2019-08-04 VITALS — BP 118/81 | HR 89 | Wt 216.0 lb

## 2019-08-04 DIAGNOSIS — Z8759 Personal history of other complications of pregnancy, childbirth and the puerperium: Secondary | ICD-10-CM

## 2019-08-04 DIAGNOSIS — N898 Other specified noninflammatory disorders of vagina: Secondary | ICD-10-CM

## 2019-08-04 DIAGNOSIS — R3989 Other symptoms and signs involving the genitourinary system: Secondary | ICD-10-CM

## 2019-08-04 LAB — POCT URINALYSIS DIPSTICK
Bilirubin, UA: NEGATIVE
Blood, UA: NEGATIVE
Glucose, UA: NEGATIVE
Ketones, UA: NEGATIVE
Leukocytes, UA: NEGATIVE
Nitrite, UA: NEGATIVE
Protein, UA: NEGATIVE
Spec Grav, UA: 1.015 (ref 1.010–1.025)
Urobilinogen, UA: NEGATIVE E.U./dL — AB
pH, UA: 6.5 (ref 5.0–8.0)

## 2019-08-04 NOTE — Progress Notes (Signed)
OB/GYN OFFICE VISIT NOTE  History:  34 y.o. G1P1001 here today for perineal laceration evaluation. She delivered by FAVD complicated by 3A perineal and sulcus lacerations. She continues to have some perineal pain, mostly with BMs. BMs are soft but she is afraid to eat much in fear of needing to have BM. She is using Colace and Magnesium Oxide daily. Lochia is minimal. Also reports tingling feeling at urethra. Denies urinary sx.  Past Medical History:  Diagnosis Date  . Asthma    albuterol- 2 days ago    History reviewed. No pertinent surgical history.  The following portions of the patient's history were reviewed and updated as appropriate: allergies, current medications, past family history, past medical history, past social history, past surgical history and problem list.   Review of Systems:  Pertinent items noted in HPI and remainder of comprehensive ROS otherwise negative.  Objective:  Physical Exam BP 118/81   Pulse 89   Wt 216 lb (98 kg)   LMP 10/02/2018   Breastfeeding Yes   BMI 39.51 kg/m  CONSTITUTIONAL: Well-developed, well-nourished female in no acute distress.  HENT:  Normocephalic, atraumatic.  SKIN: Skin is warm and dry. No rash noted. Not diaphoretic. No erythema. No pallor. NEUROLOGIC: Alert and oriented to person, place, and time.  PSYCHIATRIC: Normal mood and affect. Normal behavior. Normal judgment and thought content. CARDIOVASCULAR: Normal heart rate noted RESPIRATORY: Effort and rate normal.   PELVIC: Laceration well approximated w/suture, healing, no palpable defect   Results for orders placed or performed in visit on 08/04/19 (from the past 24 hour(s))  POCT Urinalysis Dipstick     Status: Abnormal   Collection Time: 08/04/19 11:43 AM  Result Value Ref Range   Color, UA yellow    Clarity, UA clear    Glucose, UA Negative Negative   Bilirubin, UA neg    Ketones, UA neg    Spec Grav, UA 1.015 1.010 - 1.025   Blood, UA neg    pH, UA 6.5 5.0 - 8.0    Protein, UA Negative Negative   Urobilinogen, UA negative (A) 0.2 or 1.0 E.U./dL   Nitrite, UA neg    Leukocytes, UA Negative Negative   Appearance clear    Odor none    Assessment & Plan:  1. Third degree perineal laceration - healing - Ibuprofen/Tylenol prn - avoid hard stools/straining  2. Urethralgia - POCT Urinalysis Dipstick - normal, no sign of UTI  3. Hx of PEC - BP stable, no meds  Please refer to After Visit Summary for other counseling recommendations.   Follow up in 2 weeks as scheduled   Donette Larry, CNM 08/04/2019 1:15 PM

## 2019-08-17 ENCOUNTER — Other Ambulatory Visit: Payer: Self-pay

## 2019-08-17 ENCOUNTER — Encounter: Payer: Self-pay | Admitting: Obstetrics and Gynecology

## 2019-08-17 ENCOUNTER — Ambulatory Visit (INDEPENDENT_AMBULATORY_CARE_PROVIDER_SITE_OTHER): Payer: Medicaid Other | Admitting: Obstetrics and Gynecology

## 2019-08-17 DIAGNOSIS — O99893 Other specified diseases and conditions complicating puerperium: Secondary | ICD-10-CM

## 2019-08-17 DIAGNOSIS — M25531 Pain in right wrist: Secondary | ICD-10-CM

## 2019-08-17 DIAGNOSIS — K5900 Constipation, unspecified: Secondary | ICD-10-CM

## 2019-08-17 DIAGNOSIS — M25532 Pain in left wrist: Secondary | ICD-10-CM

## 2019-08-17 DIAGNOSIS — O9963 Diseases of the digestive system complicating the puerperium: Secondary | ICD-10-CM

## 2019-08-17 NOTE — Patient Instructions (Addendum)
COVID-19 Vaccine Information can be found at: [email or web address] For questions related to vaccine distribution or appointments, please email [email or web address] or call [phone].      Take the Colace (stool softener) once per day, even if you do not think you need it. If stools remain hard, you may increase taking the Colace to twice per day. Continue to drink plenty of water, you should be drinking 8-12 glasses per day in order to relieve constipation.   Take Metamucil as directed (one tablespoon in a glass of water) once per day with dinner. If you are able to start having bowel movements daily, then you can keep taking it once daily. If you are not having daily bowel movements, then start taking the Metamucil twice per day. Please call if your symptoms are not improving. Continue to drink plenty of water, you should be drinking 8-12 glasses of water per day in order to relieve constipation. If you find that the Metamucil is giving you a lot of gas, you may take Benefiber instead. Both can be found over the counter at any pharmacy.    High-Fiber Diet Fiber, also called dietary fiber, is a type of carbohydrate that is found in fruits, vegetables, whole grains, and beans. A high-fiber diet can have many health benefits. Your health care provider may recommend a high-fiber diet to help:  Prevent constipation. Fiber can make your bowel movements more regular.  Lower your cholesterol.  Relieve the following conditions: ? Swelling of veins in the anus (hemorrhoids). ? Swelling and irritation (inflammation) of specific areas of the digestive tract (uncomplicated diverticulosis). ? A problem of the large intestine (colon) that sometimes causes pain and diarrhea (irritable bowel syndrome, IBS).  Prevent overeating as part of a weight-loss plan.  Prevent heart disease, type 2 diabetes, and certain cancers. What is my plan? The  recommended daily fiber intake in grams (g) includes:  38 g for men age 38 or younger.  30 g for men over age 27.  25 g for women age 48 or younger.  21 g for women over age 37. You can get the recommended daily intake of dietary fiber by:  Eating a variety of fruits, vegetables, grains, and beans.  Taking a fiber supplement, if it is not possible to get enough fiber through your diet. What do I need to know about a high-fiber diet?  It is better to get fiber through food sources rather than from fiber supplements. There is not a lot of research about how effective supplements are.  Always check the fiber content on the nutrition facts label of any prepackaged food. Look for foods that contain 5 g of fiber or more per serving.  Talk with a diet and nutrition specialist (dietitian) if you have questions about specific foods that are recommended or not recommended for your medical condition, especially if those foods are not listed below.  Gradually increase how much fiber you consume. If you increase your intake of dietary fiber too quickly, you may have bloating, cramping, or gas.  Drink plenty of water. Water helps you to digest fiber. What are tips for following this plan?  Eat a wide variety of high-fiber foods.  Make sure that half of the grains that you eat each day are whole grains.  Eat breads and cereals that are made with whole-grain flour instead of refined flour or white flour.  Eat brown rice, bulgur wheat, or millet instead of white rice.  Start the day with a breakfast that is  high in fiber, such as a cereal that contains 5 g of fiber or more per serving.  Use beans in place of meat in soups, salads, and pasta dishes.  Eat high-fiber snacks, such as berries, raw vegetables, nuts, and popcorn.  Choose whole fruits and vegetables instead of processed forms like juice or sauce. What foods can I eat?  Fruits Berries. Pears. Apples. Oranges. Avocado. Prunes and  raisins. Dried figs. Vegetables Sweet potatoes. Spinach. Kale. Artichokes. Cabbage. Broccoli. Cauliflower. Green peas. Carrots. Squash. Grains Whole-grain breads. Multigrain cereal. Oats and oatmeal. Brown rice. Barley. Bulgur wheat. Millet. Quinoa. Bran muffins. Popcorn. Rye wafer crackers. Meats and other proteins Navy, kidney, and pinto beans. Soybeans. Split peas. Lentils. Nuts and seeds. Dairy Fiber-fortified yogurt. Beverages Fiber-fortified soy milk. Fiber-fortified orange juice. Other foods Fiber bars. The items listed above may not be a complete list of recommended foods and beverages. Contact a dietitian for more options. What foods are not recommended? Fruits Fruit juice. Cooked, strained fruit. Vegetables Fried potatoes. Canned vegetables. Well-cooked vegetables. Grains White bread. Pasta made with refined flour. White rice. Meats and other proteins Fatty cuts of meat. Fried chicken or fried fish. Dairy Milk. Yogurt. Cream cheese. Sour cream. Fats and oils Butters. Beverages Soft drinks. Other foods Cakes and pastries. The items listed above may not be a complete list of foods and beverages to avoid. Contact a dietitian for more information. Summary  Fiber is a type of carbohydrate. It is found in fruits, vegetables, whole grains, and beans.  There are many health benefits of eating a high-fiber diet, such as preventing constipation, lowering blood cholesterol, helping with weight loss, and reducing your risk of heart disease, diabetes, and certain cancers.  Gradually increase your intake of fiber. Increasing too fast can result in cramping, bloating, and gas. Drink plenty of water while you increase your fiber.  The best sources of fiber include whole fruits and vegetables, whole grains, nuts, seeds, and beans. This information is not intended to replace advice given to you by your health care provider. Make sure you discuss any questions you have with your  health care provider. Document Released: 01/26/2005 Document Revised: 11/30/2016 Document Reviewed: 11/30/2016 Elsevier Interactive Patient Education  2019 [employer].

## 2019-08-17 NOTE — Progress Notes (Signed)
Pt does not want exam

## 2019-08-17 NOTE — Progress Notes (Signed)
Obstetrics/Postpartum Visit  Appointment Date: 08/17/2019  OBGYN Clinic: [facility]  Primary Care Provider: Patient, No Pcp Per  Chief Complaint:  Chief Complaint  Patient presents with  . Postpartum Care    History of Present Illness: Hannah Crosby is a 34 y.o. African-American G1P1001 (No LMP recorded.), seen for the above chief complaint. Her past medical history is significant for asthma.  She is s/p FAVD on 07/18/19 at 41 weeks; she was discharged to home on PPD#2. Pregnancy complicated by pre-eclampsia.  Complains of bilateral wrist pain. Constipation improved with stool softeners. Overall doing well.  Vaginal bleeding or discharge: Yes  Breast or formula feeding: breast and bottle feeding Intercourse: No  Contraception: none PP depression s/s: No  Any bowel or bladder issues: No  Pap smear: no abnormalities (date: 02/2019)  Review of Systems: Positive for n/a.   Her 12 point review of systems is negative or as noted in the History of Present Illness.  Patient Active Problem List   Diagnosis Date Noted  . Preeclampsia 07/17/2019  . Vitamin D deficiency 02/20/2019  . Supervision of normal first pregnancy 02/17/2019    Medications [name] had no medications administered during this visit. Current Outpatient Medications  Medication Sig Dispense Refill  . Prenat-Fe Carbonyl-FA-Omega 3 (ONE-A-DAY WOMENS PRENATAL 1) 28-0.8-235 MG CAPS Take 1 tablet by mouth daily. 30 capsule 12  . acetaminophen (TYLENOL) 325 MG tablet Take 2 tablets (650 mg total) by mouth every 4 (four) hours as needed (for pain scale < 4). (Patient not taking: Reported on 08/04/2019) 30 tablet 0  . albuterol (VENTOLIN HFA) 108 (90 Base) MCG/ACT inhaler Inhale 1-2 puffs into the lungs every 6 (six) hours as needed for wheezing or shortness of breath. (Patient not taking: Reported on 08/04/2019)    . Blood Pressure Monitoring (BLOOD PRESSURE CUFF) MISC 1 Device by Does not apply route as  needed. (Patient not taking: Reported on 08/04/2019) 1 each 0  . dibucaine (NUPERCAINAL) 1 % OINT Place 1 application rectally as needed for anal irritation (pain with defecation). (Patient not taking: Reported on 08/04/2019) 56 g 1  . Docosahexaenoic Acid (DHA PO) Take 1 capsule by mouth daily.  (Patient not taking: Reported on 08/04/2019)    . Elastic Bandages & Supports (WRIST BRACE/LEFT SMALL) MISC 1 Device by Does not apply route daily. (Patient not taking: Reported on 08/04/2019) 1 each 0  . Elastic Bandages & Supports (WRIST BRACE/RIGHT SMALL) MISC 1 Device by Does not apply route daily. (Patient not taking: Reported on 08/04/2019) 1 each 0  . ibuprofen (ADVIL) 600 MG tablet Take 1 tablet (600 mg total) by mouth every 6 (six) hours. (Patient not taking: Reported on 08/17/2019) 30 tablet 0  . magnesium hydroxide (MILK OF MAGNESIA) 400 MG/5ML suspension Take 30 mLs by mouth daily. (Patient not taking: Reported on 08/04/2019) 355 mL 0  . senna-docusate (SENOKOT-S) 8.6-50 MG tablet Take 2 tablets by mouth daily. (Patient not taking: Reported on 08/04/2019) 60 tablet 1  . Vitamin D, Cholecalciferol, 50 MCG (2000 UT) CAPS Take 4,000 Units by mouth daily. (Patient not taking: Reported on 08/04/2019)     No current facility-administered medications for this visit.    Allergies Banana  Physical Exam:  BP 123/80   Pulse 87   Ht 5\' 2"  (1.575 m)   Wt 216 lb (98 kg)   BMI 39.51 kg/m  Body mass index is 39.51 kg/m. General appearance: Well nourished, well developed female in no acute distress.  Cardiovascular: regular  rate and rhythm Respiratory:  Normal respiratory effort Abdomen:  No masses, hernias; diffusely non tender to palpation, non distended Breasts: not examined. Neuro/Psych:  Normal mood and affect.  Skin:  Warm and dry.    PP Depression Screening:    Edinburgh Postnatal Depression Scale - 08/17/19 1554      Edinburgh Postnatal Depression Scale:  In the Past 7 Days   I have been able  to laugh and see the funny side of things. 0    I have looked forward with enjoyment to things. 0    I have blamed myself unnecessarily when things went wrong. 0    I have been anxious or worried for no good reason. 0    I have felt scared or panicky for no good reason. 0    Things have been getting on top of me. 0    I have been so unhappy that I have had difficulty sleeping. 0    I have felt sad or miserable. 0    I have been so unhappy that I have been crying. 0    The thought of harming myself has occurred to me. 0    Edinburgh Postnatal Depression Scale Total 0           Assessment: Patient is a 34 y.o. G1P1001 who is 4 weeks post partum from a forceps assisted vaginal delivery. She is doing well.   Plan:   1. Postpartum state Doing well Gave info to get COVID vaccine  2. Constipation, unspecified constipation type Take metamucil Increase fiber in diet  3. Bilateral wrist pain PT referral  Essential components of care per ACOG recommendations:  1.  Mood and well being: Patient with negative depression screening today. Reviewed local resources for support.  - Patient does not use tobacco.  - hx of drug use? No    2. Infant care and feeding:  -Patient currently breastmilk feeding? Yes If breastmilk feeding discussed return to work and pumping. If needed, patient was provided letter for work to allow for every 2-3 hr pumping breaks, and to be granted a private location to express breastmilk and refrigerated area to store breastmilk. Reviewed importance of draining breast regularly to support lactation. -Social determinants of health (SDOH) reviewed in EPIC. No concerns  3. Sexuality, contraception and birth spacing - Patient does not want a pregnancy in the next year.   - Reviewed forms of contraception in tiered fashion. Patient desired no method today.   - Discussed birth spacing of 18 months  4. Sleep and fatigue -Encouraged family/partner/community support of 4  hrs of uninterrupted sleep to help with mood and fatigue  5. Physical Recovery  - Discussed patients delivery and complications - Patient had a 3A degree laceration, perineal healing reviewed. Patient expressed understanding - Patient has urinary incontinence? No  - Patient is safe to resume physical and sexual activity  6.  Health Maintenance - Last pap smear done 02/2019 and was normal with negative HPV.   RTC prn   Baldemar Lenis, M.D. Attending Center for [employer] [facility])

## 2019-09-04 ENCOUNTER — Ambulatory Visit: Payer: Medicaid Other | Attending: Obstetrics and Gynecology | Admitting: Physical Therapy

## 2019-09-27 ENCOUNTER — Ambulatory Visit: Payer: Medicaid Other | Admitting: Physical Therapy

## 2019-10-02 ENCOUNTER — Encounter: Payer: Medicaid Other | Admitting: Physical Therapy

## 2020-04-01 ENCOUNTER — Ambulatory Visit (INDEPENDENT_AMBULATORY_CARE_PROVIDER_SITE_OTHER): Payer: Medicaid Other | Admitting: Obstetrics and Gynecology

## 2020-04-01 ENCOUNTER — Encounter: Payer: Self-pay | Admitting: Obstetrics and Gynecology

## 2020-04-01 ENCOUNTER — Other Ambulatory Visit: Payer: Self-pay

## 2020-04-01 VITALS — BP 123/76 | HR 81 | Resp 16 | Ht 62.0 in | Wt 213.0 lb

## 2020-04-01 DIAGNOSIS — Z30017 Encounter for initial prescription of implantable subdermal contraceptive: Secondary | ICD-10-CM

## 2020-04-01 DIAGNOSIS — Z01812 Encounter for preprocedural laboratory examination: Secondary | ICD-10-CM | POA: Diagnosis not present

## 2020-04-01 LAB — POCT URINE PREGNANCY: Preg Test, Ur: NEGATIVE

## 2020-04-01 MED ORDER — ETONOGESTREL 68 MG ~~LOC~~ IMPL
68.0000 mg | DRUG_IMPLANT | Freq: Once | SUBCUTANEOUS | Status: AC
  Administered 2020-04-01: 68 mg via SUBCUTANEOUS

## 2020-04-01 NOTE — Progress Notes (Signed)
     GYNECOLOGY OFFICE PROCEDURE NOTE  Hannah Crosby is a 35 y.o. G1P1001 here for Nexplanon insertion.  Last pap smear was on 02/2019 and was normal.  No other gynecologic concerns. Denies unprotected intercourse within the last 14 days. UPT: negative  Reviewed risks of insertion of implant including risk of infection, bleeding, damage to surrounding tissues and organs, migration of implant, difficult removal. She verbalizes understanding and affirms desire to proceed. Consent signed.   Nexplanon Insertion Procedure Patient identified, informed consent performed, consent signed.   Patient does understand that irregular bleeding is a very common side effect of this medication. She was advised to have backup contraception for one week after placement. Pregnancy test in clinic today was negative.  An adequate timeout was performed.  Patient's left arm was prepped and draped in the usual sterile fashion. The ruler used to measure and mark insertion area.  Patient was prepped with alcohol swab and then injected with 3 ml of 1% lidocaine.  She was prepped with betadine, Nexplanon removed from packaging,  Device confirmed in needle, then inserted full length of needle and withdrawn per handbook instructions. Nexplanon was able to palpated in the patient's arm; patient palpated the insert herself. There was minimal blood loss.  Patient insertion site covered with gauze and a pressure bandage to reduce any bruising.  The patient tolerated the procedure well and was given post procedure instructions.    Device Info Exp: 06/21/2022 Lot#: [id number]  K. Therese Sarah, MD, [facility] Attending Center for [facility])

## 2020-07-02 ENCOUNTER — Ambulatory Visit
Admission: RE | Admit: 2020-07-02 | Discharge: 2020-07-02 | Disposition: A | Discharge status: Home / Self Care | Payer: Medicaid Other | Source: Ambulatory Visit | Attending: Family Medicine | Admitting: Family Medicine

## 2020-07-02 ENCOUNTER — Other Ambulatory Visit: Payer: Self-pay | Admitting: Family Medicine

## 2020-07-02 ENCOUNTER — Other Ambulatory Visit: Payer: Self-pay

## 2020-07-02 DIAGNOSIS — R52 Pain, unspecified: Secondary | ICD-10-CM

## 2021-03-04 ENCOUNTER — Other Ambulatory Visit: Payer: Self-pay

## 2021-03-04 ENCOUNTER — Ambulatory Visit (INDEPENDENT_AMBULATORY_CARE_PROVIDER_SITE_OTHER): Payer: BC Managed Care – PPO | Admitting: Women's Health

## 2021-03-04 ENCOUNTER — Encounter: Payer: Self-pay | Admitting: Women's Health

## 2021-03-04 VITALS — BP 119/79 | HR 97 | Resp 16 | Ht 62.0 in | Wt 226.0 lb

## 2021-03-04 DIAGNOSIS — Z3046 Encounter for surveillance of implantable subdermal contraceptive: Secondary | ICD-10-CM

## 2021-03-04 NOTE — Patient Instructions (Signed)
You have had the Nexplanon removed today. Your return to fertility is immediate and you could become pregnant at any time. It may take a few months for your periods to return to normal. Keep the outer bandage on and keep it clean and dry for the next 24 hours. Tomorrow morning, you can remove the bandage and shower as usual. The stickers on the skin will fall off on their own over the next 1-2 weeks. Do not peel them off.  Do not soak your arm (no bath tubs, hot tubs, swimming pools, etc.) until the incision has completely healed, usually within about 1-2 weeks. If you notice any signs of infection (increased pain, redness, warmth, drainage, fever above 100.4 degrees), call back to the office immediately. 

## 2021-03-04 NOTE — Progress Notes (Signed)
Ms. Hannah Crosby is a 36 y.o. G1P1001 here for Nexplanon removal with no c/o. Nexplanon was inserted 03/2020 ago at [facility]. Pt is sure of decision to remove Nexplanon as she feels it has contributed to significant weight gain for her. Informed consent document signed. Pt has NKDA. Nexplanon easily palpated on inner aspect of left upper arm above sulcus. Sterile gloves donned and betadine applied to the area x2.  BP 119/79    Pulse 97    Resp 16    Ht 5\' 2"  (1.575 m)    Wt 226 lb (102.5 kg)    Breastfeeding No    BMI 41.34 kg/m   Lidocaine 1%, 53mL inserted under the distal end of Nexplanon. Confirmed pt was anesthetized with sharp test of scalpel. With the proximal end pressed down, a small, longitudinal <72mm incision was made with a #11 scalpel, starting at the distal tip of the implant. Once the tip was visualized, the Nexplanon was grasped with curved forceps and removed gently, while maintaining traction on the skin above the proximal end.  Nexplanon removed without difficulty, intact, and shown to patient. Confirmed entire 4cm length was removed. Incision was then closed with a steri-strip, followed by an adhesive bandage and then pressure bandage with sterile gauze. Pt instructed to leave pressure dressing on for 24hrs and steri-strip on for 3-5days. Pt instructed to wash area with soap and water by hand for the next 5 days. Pt does not want children at this time and states she is not sexually active because her partner lives in [place]  [place], NP  3:29 PM 03/04/2021

## 2021-04-15 ENCOUNTER — Emergency Department (HOSPITAL_COMMUNITY)
Admission: EM | Admit: 2021-04-15 | Discharge: 2021-04-15 | Disposition: A | Discharge status: Home / Self Care | Payer: BC Managed Care – PPO | Attending: Emergency Medicine | Admitting: Emergency Medicine

## 2021-04-15 ENCOUNTER — Encounter (HOSPITAL_COMMUNITY): Payer: Self-pay | Admitting: Emergency Medicine

## 2021-04-15 ENCOUNTER — Other Ambulatory Visit: Payer: Self-pay

## 2021-04-15 DIAGNOSIS — R200 Anesthesia of skin: Secondary | ICD-10-CM

## 2021-04-15 DIAGNOSIS — B309 Viral conjunctivitis, unspecified: Secondary | ICD-10-CM | POA: Diagnosis not present

## 2021-04-15 DIAGNOSIS — R202 Paresthesia of skin: Secondary | ICD-10-CM

## 2021-04-15 DIAGNOSIS — H5713 Ocular pain, bilateral: Secondary | ICD-10-CM | POA: Diagnosis present

## 2021-04-15 MED ORDER — NAPHAZOLINE-PHENIRAMINE 0.025-0.3 % OP SOLN: 1.0000 [drp] | OPHTHALMIC | 0 refills | Status: AC | PRN

## 2021-04-15 NOTE — ED Provider Notes (Signed)
?[facility] DEPT ?Provider Note ? ? ?CSN: [id number] ?Arrival date & time: 04/15/21  [id number] ? ?  ? ?History ? ?Chief Complaint  ?Patient presents with  ? Conjunctivitis  ? ? ?Hannah Crosby is a 36 y.o. female who presents to the ED for bilateral eye pain and swelling that started a few days ago.  Patient states redness and irritation started on the left side and a couple days later moved to her right side.  Her eyes and eyelids are also swollen she can barely keep her eyes open and has significant photophobia.  She denies waking up with her eyes sealed shut and she denies purulent discharge.  She has been using hot compresses without significant relief.  She also bought some allergy eyedrops that do not seem to be helping either.  She denies fever, chills, cough, congestion, shortness of breath. ? ?Patient also notes a numbness of her left thumb that started 2 days ago.  No known trauma or injury.  She denies neck pain, wrist pain and arm pain. ? ?Conjunctivitis ? ? ?  ? ?Home Medications ?Prior to Admission medications   ?Medication Sig Start Date End Date Taking? Authorizing Provider  ?naphazoline-pheniramine (NAPHCON-A) 0.025-0.3 % ophthalmic solution Place 1 drop into both eyes every 4 (four) hours as needed for eye irritation. 04/15/21  Yes Kathe Becton R, PA-C  ?acetaminophen (TYLENOL) 325 MG tablet Take 2 tablets (650 mg total) by mouth every 4 (four) hours as needed (for pain scale < 4). ?Patient not taking: Reported on 08/04/2019 07/20/19   Sparacino, Hailey L, DO  ?albuterol (VENTOLIN HFA) 108 (90 Base) MCG/ACT inhaler Inhale 1-2 puffs into the lungs every 6 (six) hours as needed for wheezing or shortness of breath. ?Patient not taking: Reported on 08/04/2019    [provider]  ?Donnal Debar 100-62.5-25 MCG/ACT AEPB Inhale 1 puff into the lungs daily. 02/27/21   [provider]  ?   ? ?Allergies    ?Banana   ? ?Review of Systems   ?Review of  Systems ? ?Physical Exam ?Updated Vital Signs ?BP 134/90   Pulse 72   Temp 97.7 ?F (36.5 ?C) (Oral)   Resp 17   Ht 5\' 2"  (1.575 m)   Wt 103 kg   SpO2 96%   BMI 41.53 kg/m?  ?Physical Exam ?Vitals and nursing note reviewed.  ?Constitutional:   ?   General: She is not in acute distress. ?   Appearance: She is not ill-appearing.  ?HENT:  ?   Head: Atraumatic.  ?Eyes:  ?   Extraocular Movements: Extraocular movements intact.  ?   Conjunctiva/sclera:  ?   Right eye: Right conjunctiva is injected.  ?   Left eye: Left conjunctiva is injected.  ?   Comments: Significant periorbital swelling/edema.  Bilateral scleral injection with mild serous discharge  ?Cardiovascular:  ?   Rate and Rhythm: Normal rate and regular rhythm.  ?   Pulses: Normal pulses.  ?   Heart sounds: No murmur heard. ?Pulmonary:  ?   Effort: Pulmonary effort is normal. No respiratory distress.  ?   Breath sounds: Normal breath sounds.  ?Abdominal:  ?   General: Abdomen is flat. There is no distension.  ?   Palpations: Abdomen is soft.  ?   Tenderness: There is no abdominal tenderness.  ?Musculoskeletal:     ?   General: Normal range of motion.  ?   Cervical back: Normal range of motion.  ?   Comments:  Left arm, wrist and digits all without swelling, palpable deformities or injury.  Negative Tinel.   ?Skin: ?   General: Skin is warm and dry.  ?   Capillary Refill: Capillary refill takes less than 2 seconds.  ?Neurological:  ?   General: No focal deficit present.  ?   Mental Status: She is alert.  ?Psychiatric:     ?   Mood and Affect: Mood normal.  ? ? ?ED Results / Procedures / Treatments   ?Labs ?(all labs ordered are listed, but only abnormal results are displayed) ?Labs Reviewed - No data to display ? ?EKG ?None ? ?Radiology ?No results found. ? ?Procedures ?Procedures  ? ?Medications Ordered in ED ?Medications - No data to display ? ?ED Course/ Medical Decision Making/ A&P ?  ?                        ?Medical Decision Making ?Risk ?OTC  drugs. ? ? ?History:  ?Per HPI ? ? ?Initial impression: ? ?This patient presents to the ED for concern of eye pain and swelling, this involves an extensive number of treatment options, and is a complaint that carries with it a high risk of complications and morbidity.   The emergent differential diagnosis for acute eye pain includes, but is not limited to ocular ischemia, optic neuritis, temporal arteritis, Sinusitis, neuralgia, Migraine, Acute angle closure glaucoma, eye trauma, uveitis, iritis, corneal abrasion/ulceration, and photokeratitis. ?Patient is overall well-appearing 36 year old female, nontoxic appearing with normal vitals.  Bilateral eyes with significant periorbital swelling and edema with scleral injection.  Serous discharge.  Symptoms consistent with viral conjunctivitis.  Additionally, left wrist and hand without evidence of acute injury.  Symptoms not really consistent with carpal tunnel.  I considered x-ray, but given the lack of injury and neurovascular symptoms, I do not think that this would be clinically helpful.  Will provide orthopedic referral for MRI if symptoms do not improve. ? ? ? ?Disposition: ? ?After consideration of the diagnostic results, physical exam, history and the patients response to treatment feel that the patent would benefit from discharge.   ?Acute viral conjunctivitis of both eyes:  ?Numbness and tingling of the left: Discussed at home supportive care measures for viral conjunctivitis.  Recommend artificial tears, cool compresses and frequent handwashing.  Also prescribed Naphcon drops for symptomatic relief.  Ophthalmology referral provided if symptoms do not improve after 7 days.  For her left thumb numbness, given her orthopedic referral for possible MRI if symptoms do not improve on their own. ? ? ?Final Clinical Impression(s) / ED Diagnoses ?Final diagnoses:  ?Acute viral conjunctivitis of both eyes  ?Numbness and tingling of left thumb  ? ? ?Rx / DC Orders ?ED  Discharge Orders   ? ?      Ordered  ?  naphazoline-pheniramine (NAPHCON-A) 0.025-0.3 % ophthalmic solution  Every 4 hours PRN       ? 04/15/21 0752  ? ?  ?  ? ?  ? ? ?  ?Tonye Pearson, [place] ?04/15/21 [id number] ? ?  ?Lucrezia Starch, MD ?04/16/21 1156 ? ?

## 2021-04-15 NOTE — Discharge Instructions (Addendum)
You were diagnosed with viral conjunctivitis of both eyes.  I have sent you in an an eyedrop that you can use every 4 hours as needed which may help with pain and discomfort.  You can also use artificial tears throughout the day as needed - some options include Refresh tears, Systane tears or Blink tears. Avoid drops that advertise redness relief.  If you are at home, apply cool compresses to both eyes for about 15 to 20 minutes at a time.  Remember to wash your hands frequently as this is highly contagious.  If your symptoms do not improve in 7 days, please follow-up with the provided ophthalmology referral. ? ?Additionally, you have mentioned numbness and tingling of your thumb.  Given the fact that you do not have a known injury or trauma, I think that you should follow-up with either your primary care doctor or the provided orthopedic referral if your symptoms do not improve over the next couple of weeks.  You may need an MRI for further evaluation. ? ? ?

## 2021-04-15 NOTE — ED Triage Notes (Signed)
Patient reports her L eye was red, itching and painful Sunday, it has since spread to her R eye. Reports waking up with some crusting, painful.  ?

## 2021-04-24 IMAGING — US US MFM OB DETAIL+14 WK
1 series · 13 of 28 positions shown · non-contrast
Comparison: none

[Series 1: us mfm ob detail+14 wk · 110 acquisitions, 13 frames shown]
[im 5/110]
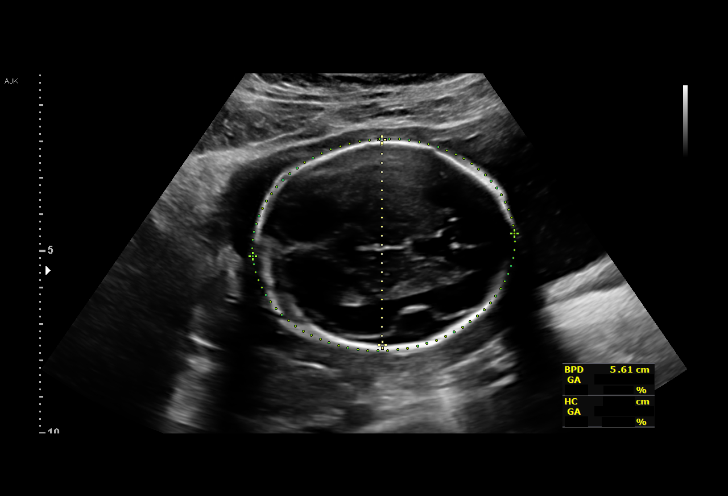
[im 13/110]
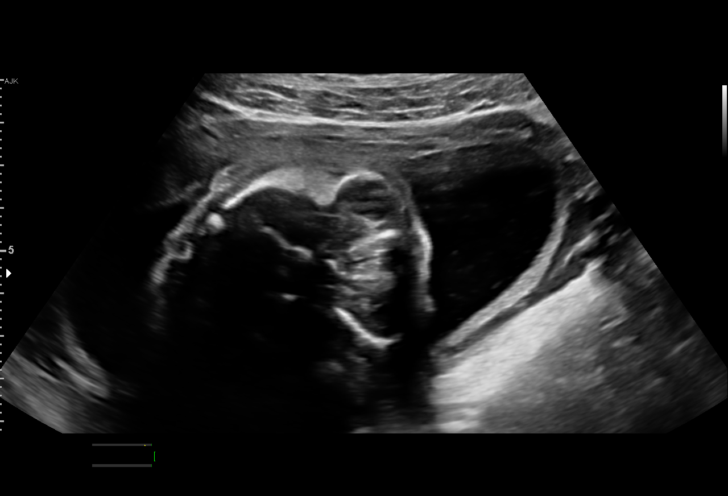
[im 21/110]
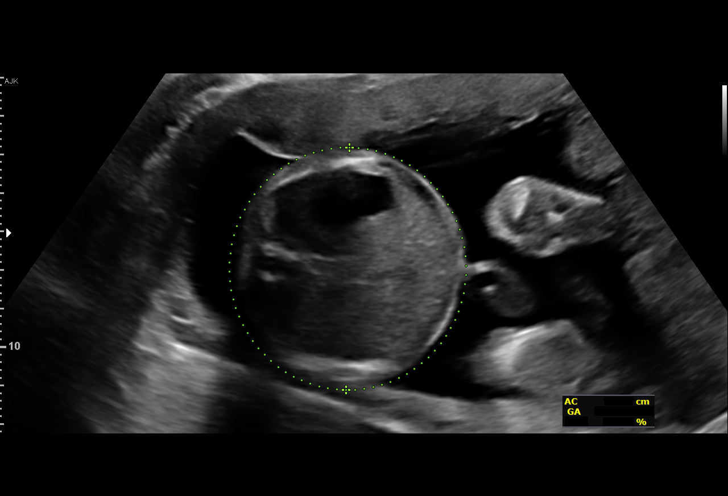
[im 29/110]
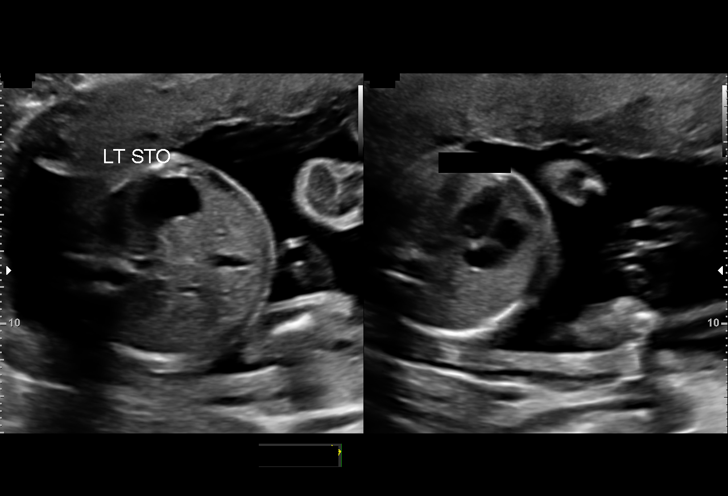
[im 37/110]
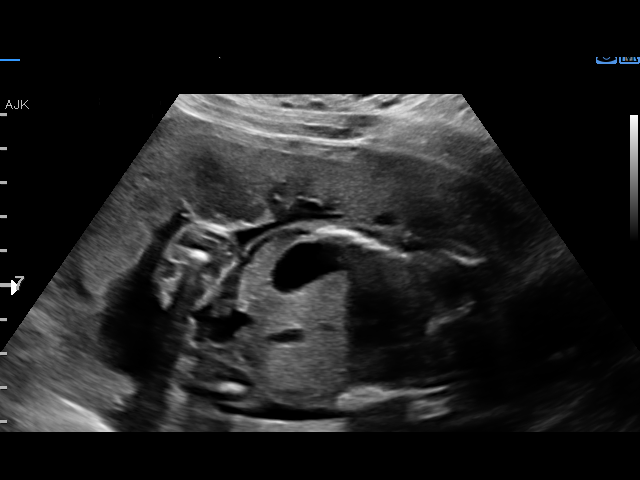
[im 45/110]
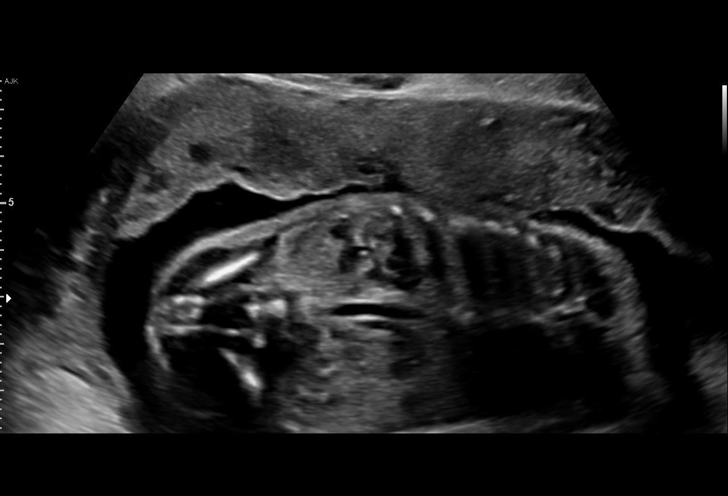
[im 57/110]
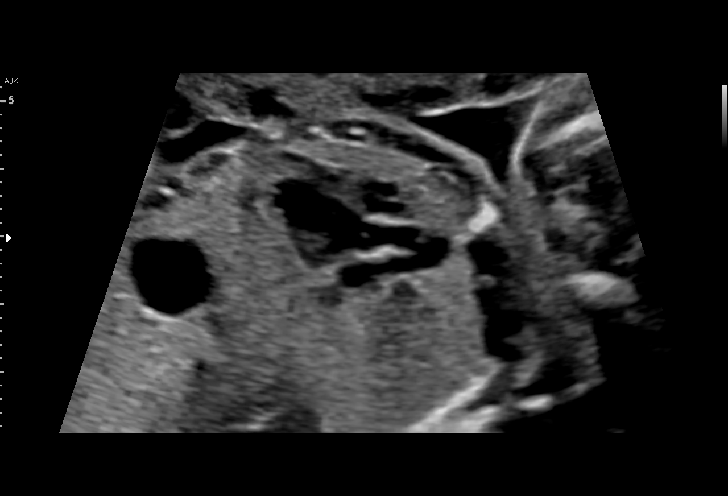
[im 65/110]
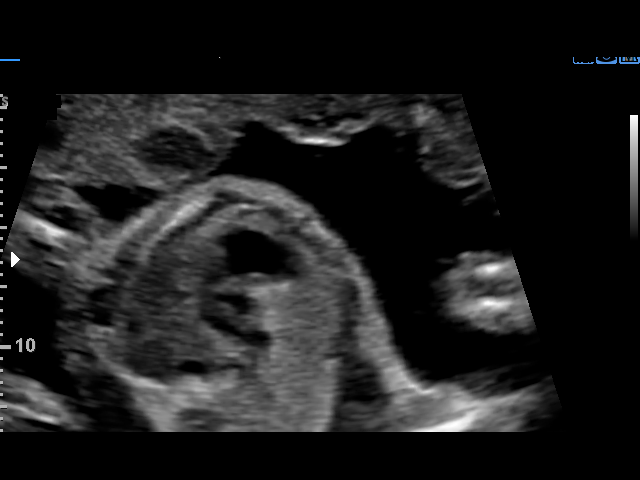
[im 73/110]
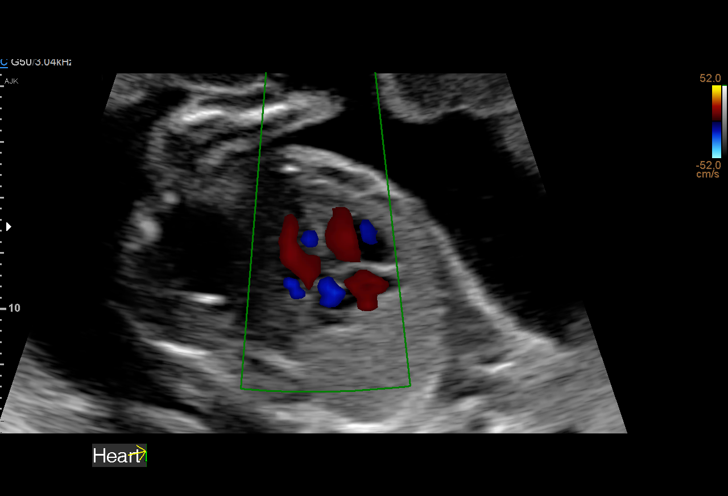
[im 81/110]
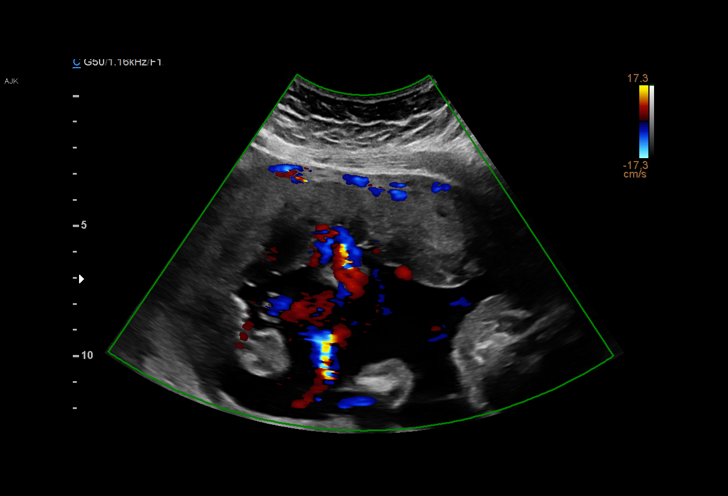
[im 89/110]
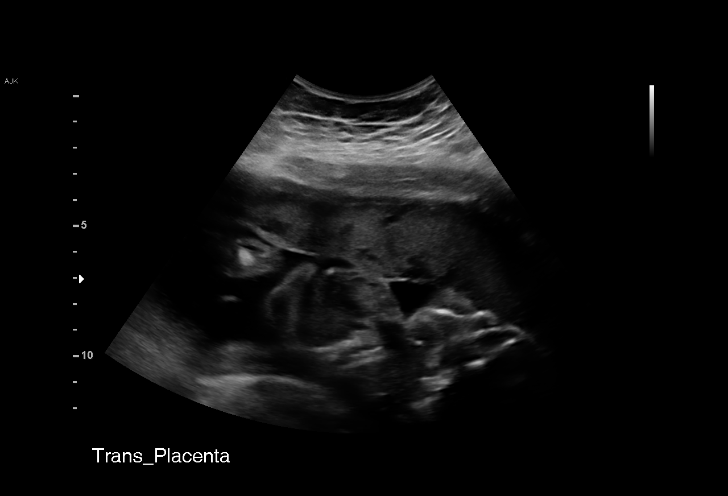
[im 97/110]
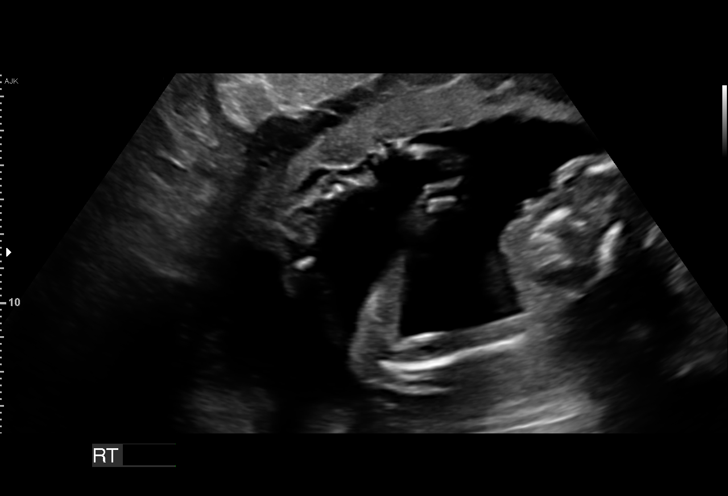
[im 105/110]
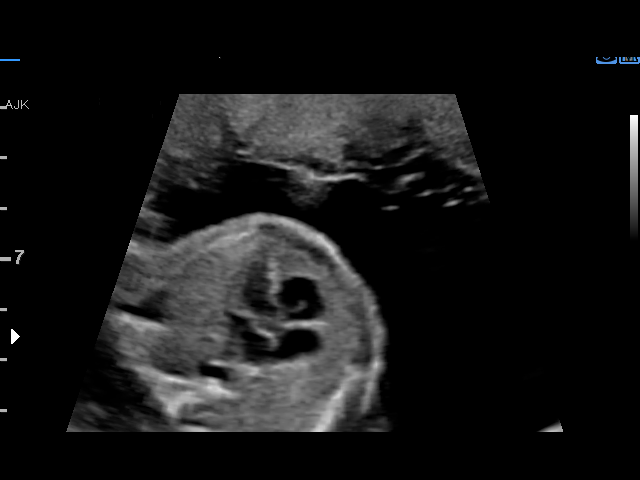

[13 of 28 positions shown; findings below may reference images not displayed]

IAN HENG CNM

 ----------------------------------------------------------------------

 ----------------------------------------------------------------------
Indications

  22 weeks gestation of pregnancy
  Encounter for antenatal screening for
  malformations (neg horizon)
  Obesity complicating pregnancy, second
  trimester
 ----------------------------------------------------------------------
Vital Signs

 BMI:
Fetal Evaluation

 Num Of Fetuses:         1
 Fetal Heart Rate(bpm):  157
 Cardiac Activity:       Observed
 Presentation:           Cephalic
 Placenta:               Anterior
 P. Cord Insertion:      Visualized, central

 Amniotic Fluid
 AFI FV:      Within normal limits

                             Largest Pocket(cm)

Biometry

 BPD:      56.3  mm     G. Age:  23w 1d         70  %    CI:        76.32   %    70 - 86
                                                         FL/HC:      19.6   %    19.2 -
 HC:      204.2  mm     G. Age:  22w 4d         35  %    HC/AC:      1.05        1.05 -
 AC:      193.6  mm     G. Age:  24w 0d         85  %    FL/BPD:     71.0   %    71 - 87
 FL:         40  mm     G. Age:  22w 6d         51  %    FL/AC:      20.7   %    20 - 24
 HUM:      34.9  mm     G. Age:  22w 0d         32  %
 CER:      25.3  mm     G. Age:  23w 2d         61  %

 LV:        4.4  mm

 Est. FW:     595  gm      1 lb 5 oz     84  %
OB History

 Gravidity:    1
Gestational Age

 LMP:           22w 4d        Date:  10/02/18                 EDD:   07/09/19
 U/S Today:     23w 1d                                        EDD:   07/05/19
 Best:          22w 4d     Det. By:  LMP  (10/02/18)          EDD:   07/09/19
Anatomy

 Cranium:               Appears normal         LVOT:                   Appears normal
 Cavum:                 Appears normal         Aortic Arch:            Appears normal
 Ventricles:            Appears normal         Ductal Arch:            Appears normal
 Choroid Plexus:        Appears normal         Diaphragm:              Appears normal
 Cerebellum:            Appears normal         Stomach:                Appears normal, left
                                                                       sided
 Posterior Fossa:       Appears normal         Abdomen:                Normal, see
                                                                       comments
 Nuchal Fold:           Not applicable (>20    Abdominal Wall:         Appears nml (cord
                        wks GA)                                        insert, abd wall)
 Face:                  Appears normal         Cord Vessels:           Appears normal (3
                        (orbits and profile)                           vessel cord)
 Lips:                  Appears normal         Kidneys:                Appear normal
 Palate:                Not well visualized    Bladder:                Appears normal
 Thoracic:              Appears normal         Spine:                  Not well visualized
 Heart:                 Appears normal         Upper Extremities:      Appears normal
                        (4CH, axis, and
                        situs)
 RVOT:                  Appears normal         Lower Extremities:      Appears normal

 Other:  Fetus appears to be a male. Heels and RT 5th digit visualized. Nasal
         bone visualized. Open hands visualized.
Cervix Uterus Adnexa

 Cervix
 Length:           4.99  cm.
 Normal appearance by transabdominal scan.

 Uterus
 No abnormality visualized.

 Left Ovary
 Within normal limits.

 Right Ovary
 Not visualized.

 Adnexa
 No abnormality visualized.
Impression

 G1 P0. Patient is here for fetal anatomy scan. She had
 initiated her prenatal care in [place] where she is a school
 teacher. Her pregnancy is well-dated by LMP date and that
 (GA) is consistent with early ultrasound measurements.
 Patient reports no chronic medical conditions.

 On cell-free fetal DNA screening, the risks of fetal
 aneuploidies are not increased.

 We performed fetal anatomy scan. No makers of
 aneuploidies or fetal structural defects are seen. Fetal
 biometry is consistent with her previously-established dates.
 Amniotic fluid is normal and good fetal activity is seen. .An
 echogenic material is seen in the wall of the stomach. I
 reassured her that it could represent calcification or debris
 and is not associated with fetal adverse outcomes. Patient
 understands the limitations of ultrasound in detecting fetal
 anomalies.
Recommendations

 -An appointment was made for her to return in 4 weeks for
 completion of fetal anatomy.
                 [place], [place]

## 2021-05-15 NOTE — Progress Notes (Signed)
? ?  ANNUAL EXAM ?Patient name: Hannah Crosby MRN [id number]  Date of birth: [birth year] ?Chief Complaint:   ?Annual Exam ? ?History of Present Illness:   ?Hannah Crosby is a 36 y.o. G65P1001 female being seen today for a routine annual exam.  ? ?Current complaints: None ? ?Patient's last menstrual period was 05/05/2021.  ? ? ?Last pap 02/2019. Results were: NILM w/ HRHPV negative. H/O abnormal pap: no ?Health Maintenance Due  ?Topic Date Due  ? COVID-19 Vaccine (1) Never done  ? Hepatitis C Screening  Never done  ? ? ? ?   ? View : No data to display.  ?  ?  ?  ? ?  ?   ? View : No data to display.  ?  ?  ?  ? ? ? ?Review of Systems:   ?Pertinent items are noted in HPI ?Denies any headaches, blurred vision, fatigue, shortness of breath, chest pain, abdominal pain, abnormal vaginal discharge/itching/odor/irritation, problems with periods, bowel movements, urination, or intercourse unless otherwise stated above.  ?Pertinent History Reviewed:  ?Reviewed past medical,surgical, social and family history.  ?Reviewed problem list, medications and allergies. ?Physical Assessment:  ? ?Vitals:  ? 05/19/21 1356  ?BP: 116/78  ?Pulse: 82  ?Weight: 230 lb (104.3 kg)  ?Body mass index is 42.07 kg/m?. ?  ?Physical Examination:  ?General appearance - well appearing, and in no distress ?Mental status - alert, oriented to person, place, and time ?Psych:  She has a normal mood and affect ?Skin - warm and dry, normal color, no suspicious lesions noted ?Chest - effort normal, all lung fields clear to auscultation bilaterally ?Heart - normal rate and regular rhythm ?Neck:  midline trachea, no thyromegaly or nodules ?Breasts - breasts appear normal, no suspicious masses, no skin or nipple changes or  axillary nodes ?Abdomen - soft, nontender, nondistended, no masses or organomegaly ?Pelvic -  ?VULVA: normal appearing vulva with no masses, tenderness or lesions   ?VAGINA: normal appearing vagina with normal color and discharge, no  lesions   ?CERVIX: normal appearing cervix without discharge or lesions, no CMT ?UTERUS: uterus is felt to be normal size, shape, consistency and nontender  ?ADNEXA: No adnexal masses or tenderness noted. ?Extremities:  No swelling or varicosities noted ? ?Chaperone present for exam ? ?No results found for this or any previous visit (from the past 24 hour(s)).  ?Assessment & Plan:  ?Hannah Crosby was seen today for annual exam. ? ?Diagnoses and all orders for this visit: ? ?Encounter for annual routine gynecological examination ?- Cervical cancer screening: Discussed guidelines. Pap with HPV done and wnl 02/2019 ?- Gardasil: She may have had - will see if Dr. Ernie Hew gave it ?- GC/CT: accepts ?- Birth Control: None - declines. Gains weight with all forms.  ?- Breast Health: Encouraged self breast awareness/SBE. Teaching provided.  ?- F/U 12 months and prn  ? ?Routine screening for STI (sexually transmitted infection) ?-     Cervicovaginal ancillary only( [place]) ? ? ? ? ? ? ?No orders of the defined types were placed in this encounter. ? ? ?Meds: No orders of the defined types were placed in this encounter. ? ? ?Follow-up: Return in about 1 year (around 05/20/2022) for annual. ? ?Radene Gunning, MD ?05/19/2021 ?2:27 PM ?

## 2021-05-19 ENCOUNTER — Ambulatory Visit (INDEPENDENT_AMBULATORY_CARE_PROVIDER_SITE_OTHER): Payer: BC Managed Care – PPO | Admitting: Obstetrics and Gynecology

## 2021-05-19 ENCOUNTER — Other Ambulatory Visit (HOSPITAL_COMMUNITY)
Admission: RE | Admit: 2021-05-19 | Discharge: 2021-05-19 | Disposition: A | Discharge status: Home / Self Care | Payer: BC Managed Care – PPO | Source: Ambulatory Visit | Attending: Obstetrics and Gynecology | Admitting: Obstetrics and Gynecology

## 2021-05-19 ENCOUNTER — Encounter: Payer: Self-pay | Admitting: Obstetrics and Gynecology

## 2021-05-19 VITALS — BP 116/78 | HR 82 | Wt 230.0 lb

## 2021-05-19 DIAGNOSIS — Z113 Encounter for screening for infections with a predominantly sexual mode of transmission: Secondary | ICD-10-CM | POA: Insufficient documentation

## 2021-05-19 DIAGNOSIS — Z01419 Encounter for gynecological examination (general) (routine) without abnormal findings: Secondary | ICD-10-CM

## 2021-05-19 NOTE — Progress Notes (Signed)
Last pap 02/17/19- negative ?

## 2021-05-20 LAB — CERVICOVAGINAL ANCILLARY ONLY
Chlamydia: NEGATIVE
Comment: NEGATIVE
Comment: NORMAL
Neisseria Gonorrhea: NEGATIVE

## 2021-09-25 ENCOUNTER — Ambulatory Visit
Admission: RE | Admit: 2021-09-25 | Discharge: 2021-09-25 | Disposition: A | Discharge status: Home / Self Care | Payer: No Typology Code available for payment source | Source: Ambulatory Visit | Attending: Obstetrics and Gynecology | Admitting: Obstetrics and Gynecology

## 2021-09-25 ENCOUNTER — Other Ambulatory Visit: Payer: Self-pay | Admitting: Obstetrics and Gynecology

## 2021-09-25 DIAGNOSIS — R7612 Nonspecific reaction to cell mediated immunity measurement of gamma interferon antigen response without active tuberculosis: Secondary | ICD-10-CM
